# Patient Record
Sex: Female | Born: 1970 | Race: Black or African American | Hispanic: No | Smoking: Never smoker
Health system: Southern US, Community
[De-identification: ages and names within clinical notes are randomized; demographics above are authoritative.]

## PROBLEM LIST (undated history)

## (undated) DIAGNOSIS — J09X2 Influenza due to identified novel influenza A virus with other respiratory manifestations: Secondary | ICD-10-CM

## (undated) DIAGNOSIS — M940 Chondrocostal junction syndrome [Tietze]: Secondary | ICD-10-CM

## (undated) DIAGNOSIS — Z9889 Other specified postprocedural states: Secondary | ICD-10-CM

## (undated) DIAGNOSIS — D259 Leiomyoma of uterus, unspecified: Secondary | ICD-10-CM

## (undated) DIAGNOSIS — S83205S Other tear of unspecified meniscus, current injury, unspecified knee, sequela: Secondary | ICD-10-CM

## (undated) DIAGNOSIS — D649 Anemia, unspecified: Secondary | ICD-10-CM

## (undated) HISTORY — DX: Chondrocostal junction syndrome (tietze): M94.0

## (undated) HISTORY — PX: MYOMECTOMY: SHX85

## (undated) HISTORY — DX: Influenza due to identified novel influenza A virus with other respiratory manifestations: J09.X2

## (undated) HISTORY — PX: UTERINE ARTERY EMBOLIZATION: SHX2629

## (undated) HISTORY — DX: Other specified postprocedural states: Z98.890

## (undated) HISTORY — DX: Leiomyoma of uterus, unspecified: D25.9

## (undated) HISTORY — PX: CERVICAL BIOPSY: SHX590

---

## 1998-03-25 ENCOUNTER — Inpatient Hospital Stay (HOSPITAL_COMMUNITY): Admission: AD | Admit: 1998-03-25 | Discharge: 1998-03-25 | Payer: Self-pay | Admitting: Obstetrics & Gynecology

## 1998-08-16 ENCOUNTER — Other Ambulatory Visit: Admission: RE | Admit: 1998-08-16 | Discharge: 1998-08-16 | Payer: Self-pay | Admitting: Obstetrics and Gynecology

## 1999-01-09 ENCOUNTER — Ambulatory Visit: Admission: RE | Admit: 1999-01-09 | Discharge: 1999-01-09 | Payer: Self-pay | Admitting: Otolaryngology

## 2001-12-11 ENCOUNTER — Other Ambulatory Visit: Admission: RE | Admit: 2001-12-11 | Discharge: 2001-12-11 | Payer: Self-pay | Admitting: Obstetrics & Gynecology

## 2001-12-12 ENCOUNTER — Emergency Department (HOSPITAL_COMMUNITY): Admission: EM | Admit: 2001-12-12 | Discharge: 2001-12-12 | Payer: Self-pay | Admitting: Emergency Medicine

## 2001-12-13 ENCOUNTER — Encounter (HOSPITAL_COMMUNITY): Admission: RE | Admit: 2001-12-13 | Discharge: 2001-12-13 | Payer: Self-pay | Admitting: Oncology

## 2001-12-15 ENCOUNTER — Inpatient Hospital Stay (HOSPITAL_COMMUNITY): Admission: EM | Admit: 2001-12-15 | Discharge: 2001-12-18 | Payer: Self-pay | Admitting: Emergency Medicine

## 2001-12-15 ENCOUNTER — Encounter: Payer: Self-pay | Admitting: Emergency Medicine

## 2001-12-15 ENCOUNTER — Encounter: Payer: Self-pay | Admitting: Internal Medicine

## 2001-12-16 ENCOUNTER — Encounter (INDEPENDENT_AMBULATORY_CARE_PROVIDER_SITE_OTHER): Payer: Self-pay | Admitting: Cardiology

## 2001-12-17 ENCOUNTER — Encounter: Payer: Self-pay | Admitting: Internal Medicine

## 2002-01-08 ENCOUNTER — Ambulatory Visit (HOSPITAL_COMMUNITY): Admission: RE | Admit: 2002-01-08 | Discharge: 2002-01-08 | Payer: Self-pay | Admitting: Obstetrics & Gynecology

## 2002-01-08 ENCOUNTER — Encounter: Payer: Self-pay | Admitting: Obstetrics & Gynecology

## 2002-02-21 ENCOUNTER — Ambulatory Visit (HOSPITAL_COMMUNITY): Admission: RE | Admit: 2002-02-21 | Discharge: 2002-02-21 | Payer: Self-pay | Admitting: Obstetrics and Gynecology

## 2002-02-21 ENCOUNTER — Encounter: Payer: Self-pay | Admitting: Obstetrics and Gynecology

## 2002-03-03 ENCOUNTER — Observation Stay (HOSPITAL_COMMUNITY): Admission: RE | Admit: 2002-03-03 | Discharge: 2002-03-04 | Payer: Self-pay | Admitting: Interventional Radiology

## 2004-03-25 ENCOUNTER — Other Ambulatory Visit: Admission: RE | Admit: 2004-03-25 | Discharge: 2004-03-25 | Payer: Self-pay | Admitting: Obstetrics and Gynecology

## 2007-01-24 DIAGNOSIS — J09X2 Influenza due to identified novel influenza A virus with other respiratory manifestations: Secondary | ICD-10-CM

## 2007-01-24 HISTORY — DX: Influenza due to identified novel influenza A virus with other respiratory manifestations: J09.X2

## 2008-09-30 ENCOUNTER — Inpatient Hospital Stay (HOSPITAL_COMMUNITY): Admission: RE | Admit: 2008-09-30 | Discharge: 2008-10-03 | Payer: Self-pay | Admitting: Obstetrics and Gynecology

## 2008-09-30 ENCOUNTER — Encounter (INDEPENDENT_AMBULATORY_CARE_PROVIDER_SITE_OTHER): Payer: Self-pay | Admitting: Obstetrics and Gynecology

## 2008-09-30 DIAGNOSIS — Z9889 Other specified postprocedural states: Secondary | ICD-10-CM | POA: Insufficient documentation

## 2008-09-30 HISTORY — DX: Other specified postprocedural states: Z98.890

## 2008-11-15 ENCOUNTER — Emergency Department (HOSPITAL_COMMUNITY): Admission: EM | Admit: 2008-11-15 | Discharge: 2008-11-15 | Payer: Self-pay | Admitting: Emergency Medicine

## 2010-03-23 ENCOUNTER — Ambulatory Visit (INDEPENDENT_AMBULATORY_CARE_PROVIDER_SITE_OTHER): Payer: 59 | Admitting: Cardiology

## 2010-03-23 DIAGNOSIS — R079 Chest pain, unspecified: Secondary | ICD-10-CM

## 2010-03-23 DIAGNOSIS — M940 Chondrocostal junction syndrome [Tietze]: Secondary | ICD-10-CM

## 2010-04-29 LAB — CBC
HCT: 26 % — ABNORMAL LOW (ref 36.0–46.0)
HCT: 34 % — ABNORMAL LOW (ref 36.0–46.0)
Hemoglobin: 11.4 g/dL — ABNORMAL LOW (ref 12.0–15.0)
Hemoglobin: 8.9 g/dL — ABNORMAL LOW (ref 12.0–15.0)
MCHC: 33.6 g/dL (ref 30.0–36.0)
MCV: 88.9 fL (ref 78.0–100.0)
MCV: 89 fL (ref 78.0–100.0)
Platelets: 155 10*3/uL (ref 150–400)
Platelets: 219 10*3/uL (ref 150–400)
RBC: 3.82 MIL/uL — ABNORMAL LOW (ref 3.87–5.11)
RDW: 12.8 % (ref 11.5–15.5)
WBC: 12.4 10*3/uL — ABNORMAL HIGH (ref 4.0–10.5)
WBC: 6.9 10*3/uL (ref 4.0–10.5)

## 2010-04-29 LAB — BASIC METABOLIC PANEL
BUN: 9 mg/dL (ref 6–23)
CO2: 25 mEq/L (ref 19–32)
Calcium: 9.1 mg/dL (ref 8.4–10.5)
Chloride: 104 mEq/L (ref 96–112)
Creatinine, Ser: 0.8 mg/dL (ref 0.4–1.2)
GFR calc Af Amer: >60 mL/min (ref >60)
GFR calc non Af Amer: >60 mL/min (ref >60)
Glucose, Bld: 105 mg/dL — ABNORMAL HIGH (ref 70–99)
Potassium: 3.4 mEq/L — ABNORMAL LOW (ref 3.5–5.1)
Sodium: 136 mEq/L (ref 135–145)

## 2010-04-29 LAB — TYPE AND SCREEN
ABO/RH(D): O POS
Antibody Screen: NEGATIVE

## 2010-04-29 LAB — ABO/RH: ABO/RH(D): O POS

## 2010-06-07 NOTE — H&P (Signed)
[name], [name]                ACCOUNT NO.:  [id number]   MEDICAL RECORD NO.:  [id number]        PATIENT TYPE:  WAMB   LOCATION:                                FACILITY:  [facility]   PHYSICIAN:  Crist Fat. Rivard, M.D. DATE OF BIRTH:  01/30/1971   DATE OF ADMISSION:  09/30/2008  DATE OF DISCHARGE:                              HISTORY & PHYSICAL   HISTORY OF PRESENT ILLNESS:  [name] is a 40 year old married African  American female, para 0-0-1-0, presenting for abdominal myomectomy  because of enlarging uterine fibroids.  The patient has a long-standing  history of uterine fibroids for which she underwent uterine artery  embolization in 2004 due to 2 episodes of marked menorrhagia requiring  blood transfusion.  Since that time, the patient has continued to have a  menstrual flow which last approximately 7 days ago, during which time  she changes a pad every 2 hours and will subsequently experience clear  mucousy disorder for 5 days thereafter.  She denies any menstrual  cramping, dyspareunia, back pain, fever, or pelvic pressure, but she  does notice some increased urinary frequency.  A pelvic ultrasound in  May 2010 showed uterus measuring 15.4 cm x 12.8 cm x 13.2 cm with nine  measurable fibroids:  1. A posterior subserosal fibroid 6.3 x 5.57 x 5.13 cm.  2. A posterior subserosal 3.86 cm x 3.18 x 3.57 cm.  3. Left posterior subserosal 3.67 x 3.72 x 4.04 cm.  4. Left posterior subserosal 3.4 x 3.67 x 3.62 cm.  5. A left posterior subserosal measuring 2.48 x 2.55 x 1.7 cm.  6. A left lateral subserosal measuring 3.67 x 2.78 x 2.63 cm.  7. An anterior subserosal measuring 2.58 x 2.57 x 3.03 cm.  8. An anterior subserosal measuring 3.93 x 3.91 x 4.12 cm.  9. An anterior subserosal fibroid measuring 3.25 x 3.53 x 2.49 cm.   The patient's ovaries appeared within normal range on that study as did  her endometrial lining.  Since the patient has endured uterine fibroids  for many years,  she has become intimately acquainted with both medical  and surgical management options available to her for their management  and has decided to proceed with myomectomy.   PAST MEDICAL HISTORY:  OB history:  Gravida 1, para 0-0-1-0.  GYN  history:  Menarche is 40 years old, last menstrual period September 11, 2008.  She does not use any method of contraception.  Denies any history  of sexually transmitted diseases underwent colposcopy in 1997 for an  abnormal Pap smear, but Pap smear since that time has been normal with  the most recent being in June 2010.  Medical history is positive for  severe anemia secondary to menorrhagia.  The patient had blood  transfusion on two occasions in 2003 and 2004 (hemoglobin as low as 4).   SURGICAL HISTORY:  Uterine artery embolization in 2004.  The patient  denies any problems with anesthesia.   FAMILY HISTORY:  Diabetes mellitus, hypertension, and cardiovascular  disease.   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   HABITS:  She denies alcohol, tobacco, or illicit drug use.   CURRENT MEDICATIONS:  Aleve as needed.   She has no known drug allergies, also denies any sensitivities to latex  or shellfish.   REVIEW OF SYSTEMS:  The patient does wear glasses.  She denies any  headaches, blurred vision, dizziness, unilateral weakness, chest pain,  shortness of breath, nausea, vomiting, diarrhea, pelvic pain, myalgias,  arthralgias, and except as is mentioned in history present illness the  patient's review of systems is otherwise negative.   PHYSICAL EXAMINATION:  VITAL SIGNS:  Blood pressure 122/80, pulse is 76,  respiration 14, temperature 94.9 degrees Fahrenheit orally.  Weight 202  pounds, height 5 feet 4 inches tall.  Body mass index 34.7.  NECK:  Supple without masses.  There is no thyromegaly or cervical  adenopathy.  HEART:  Regular rate and rhythm.  LUNGS:  Clear.  BACK:  No CVA  tenderness.  ABDOMEN:  No tenderness or organomegaly; however, there is a firm mass  arising from the pelvis to approximately 4 fingerbreadths below the  umbilicus.  EXTREMITIES:  No clubbing, cyanosis, or edema.  PELVIC:  EG/BUS is within normal limits.  Vagina is rugous.  Cervix is  nontender without lesions.  Uterus appears 18-20 weeks' size without  tenderness.  Adnexa without tenderness or masses.   IMPRESSION:  1. Large uterine fibroids.  2. Status post uterine artery embolization.   DISPOSITION:  A discussion was held with the patient regarding the  indications for her procedure along with its risks which include but are  not limited to reaction to anesthesia, damage to adjacent organs,  infection, excessive bleeding, and the remote possibility that  hysterectomy may be required for any life-threatening hemorrhage.  The  patient verbalized understanding of these risks.  She has given 1 unit  of autologous blood to be used during her surgery.  She has consented to  proceed with an abdominal myomectomy at [facility] on  September 30, 2008, at 8:30 a.m.      [place] J. Adline Peals.      Crist Fat Rivard, M.D.  Electronically Signed    EJP/MEDQ  D:  09/23/2008  T:  09/24/2008  Job:  [id number]

## 2010-06-10 NOTE — Discharge Summary (Signed)
[name], [name] NO.:  [id number]   MEDICAL RECORD NO.:  [id number]                   PATIENT TYPE:  INP   LOCATION:  0352                                 FACILITY:  [facility]   PHYSICIAN:  Lazaro Arms, M.D.        DATE OF BIRTH:  May 04, 1970   DATE OF ADMISSION:  12/15/2001  DATE OF DISCHARGE:  12/18/2001                                 DISCHARGE SUMMARY   GYNECOLOGIST:  Dr. Cherly Hensen.   CONSULTATIONS:  Dr. Katrinka Blazing, from the cancer center.   DISCHARGE DIAGNOSIS:  Anemia, likely secondary to menorrhagia from fibroids.   HISTORY OF PRESENT ILLNESS:  The patient was brought to the emergency room  with weakness and some chest pain.  It started when she was found to have a  hemoglobin of 4 at her gynecologist's office, for which she was seeking some  medical advice regarding her menorrhagia.  She was sent over to the cancer  center, received IV iron therapy, and then two days later received two units  of packed red cells, and she received IV iron therapy.  However, after  having the IV iron therapy, she noticed some pleuritic chest pain and  shortness of breath and headache.  These symptoms have persisted, and so she  came to the emergency room for evaluation.  A chest x-ray showed some right  lower lobe atelectasis with a very small right-sided effusion and some  baseline cardiomegaly.  Her d-dimer was slightly elevated, and so she was  admitted, ruled out for myocardial infarction, and a V/Q scan was performed,  which ruled out for PE.   HOSPITAL COURSE:  Problem 1:  The patient was transfused a total of four  units to keep her hematocrit elevated, and she was discharged home with a  hematocrit of 29 on December 18, 2001.   Problem 2:  The pleuritic chest pain was still present; however, it receded,  and her only complaint was some mild reflux which responded well to Maalox.  It was thought that this was all due to side effects from the IV  iron.  Workup of this chest pain included a V/Q scan which was negative, an  echocardiogram which showed a completely normal ejection fraction, normal  valves, and no pericarditis.  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  This was  done uneventfully by Dr. Johna Sheriff.   DISPOSITION:  The patient was discharged home in stable condition.    FOLLOW-UP:  She is to follow up at the cancer center on Friday, December 20, 2001, and she is to call to make an appointment to see Dr. Cherly Hensen next  week.  Lazaro Arms, M.D.    AMC/MEDQ  D:  12/18/2001  T:  12/18/2001  Job:  [id number]   cc:   Maxie Better, M.D.  [address]  [address]  [place]  [place] [address]  Fax: [phone]   J. Aliene Altes, M.D.  [address] [place] [facility]  [place]  [place] [address]  Fax: [phone]

## 2011-02-03 ENCOUNTER — Ambulatory Visit (INDEPENDENT_AMBULATORY_CARE_PROVIDER_SITE_OTHER): Payer: 59

## 2011-02-03 DIAGNOSIS — R509 Fever, unspecified: Secondary | ICD-10-CM

## 2011-05-02 ENCOUNTER — Ambulatory Visit (INDEPENDENT_AMBULATORY_CARE_PROVIDER_SITE_OTHER): Payer: 59 | Admitting: Obstetrics and Gynecology

## 2011-05-02 ENCOUNTER — Encounter: Payer: Self-pay | Admitting: Obstetrics and Gynecology

## 2011-05-02 VITALS — BP 110/70 | HR 70 | Wt 218.0 lb

## 2011-05-02 DIAGNOSIS — B3731 Acute candidiasis of vulva and vagina: Secondary | ICD-10-CM

## 2011-05-02 DIAGNOSIS — N898 Other specified noninflammatory disorders of vagina: Secondary | ICD-10-CM

## 2011-05-02 DIAGNOSIS — B373 Candidiasis of vulva and vagina: Secondary | ICD-10-CM

## 2011-05-02 MED ORDER — FLUCONAZOLE 150 MG PO TABS: 150.0000 mg | ORAL_TABLET | Freq: Once | ORAL | Status: DC

## 2011-05-02 MED ORDER — TERCONAZOLE 0.4 % VA CREA: 1.0000 | TOPICAL_CREAM | Freq: Every day | VAGINAL | Status: DC

## 2011-05-02 NOTE — Progress Notes (Signed)
Patient with recent antibiotic therapy complaining of vaginal itching and discomfort.  Wet Prep: ph 4.5, negative whiff and + yeast  Assessment: Candida Vaginitis  Plan: Diflucan 150 mg 1 po stat with 1 refill          Terazol 7 Vaginal Cream 1 tube 1 applicatorful pv qhs           X 7 days no refill          RTO as scheduled

## 2011-05-02 NOTE — Patient Instructions (Signed)
Minimize sugar intake Don't wear underwear to bed except with menses Drink plenty of water Avoid douching

## 2011-05-02 NOTE — Progress Notes (Signed)
Addended by: Henreitta Leber on: 05/02/2011 10:43 AM   Modules accepted: Orders

## 2011-05-03 ENCOUNTER — Ambulatory Visit (INDEPENDENT_AMBULATORY_CARE_PROVIDER_SITE_OTHER): Payer: 59 | Admitting: Cardiology

## 2011-05-03 ENCOUNTER — Encounter: Payer: Self-pay | Admitting: Cardiology

## 2011-05-03 VITALS — BP 132/86 | HR 68 | Ht 63.0 in | Wt 218.7 lb

## 2011-05-03 DIAGNOSIS — M940 Chondrocostal junction syndrome [Tietze]: Secondary | ICD-10-CM | POA: Insufficient documentation

## 2011-05-03 NOTE — Assessment & Plan Note (Signed)
Once again I think that her chest pain is related to costochondritis. I recommended nonsteroidal anti-inflammatory medication and rest. She does not require any further cardiac evaluation since clearly her symptoms are noncardiac. I recommended that she followup with her primary care physician for further management.

## 2011-05-03 NOTE — Progress Notes (Signed)
   [name] Date of Birth: Jan 06, 1971 Medical Record #[id number]  History of Present Illness: [name] seen for evaluation of chest pain. She is a 41 year old black female well known to me. She reports that 2 weeks ago he began having chest pain again. She describes this as a constant/pressure sensation. It is a dull ache localized to the midsternal region and radiates under both breasts. She denies any relation to movement or deep breathing. She's had no belching or symptoms of acid reflux. She has taken ibuprofen twice a day with some mild relief. She has a known history of costochondritis. She has had extensive evaluation of her cardiac status in the past including echocardiograms, stress Cardiolite studies, CT of the chest and multiple ECGs. She states that her chest pain will go away for a period of time but has recurred about every 3 months.  Current Outpatient Prescriptions on File Prior to Visit  Medication Sig Dispense Refill  . ibuprofen (ADVIL,MOTRIN) 800 MG tablet Take 800 mg by mouth every 8 (eight) hours as needed.        Allergies  Allergen Reactions  . Pollen Extract Itching    Pollen make pt eyes itch.  lm    Past Medical History  Diagnosis Date  . Hx of myomectomy 09-30-2008    sr did the surgery  . Costochondritis     Acute  . Uterine fibroid   . Swine flu 2009  . Chest pain   . Costochondritis     Past Surgical History  Procedure Date  . Myomectomy     sr did surgery  . Uterine artery embolization     History  Smoking status  . Never Smoker   Smokeless tobacco  . Not on file    History  Alcohol Use No    Family History  Problem Relation Age of Onset  . Hypertension Mother   . Hypertension Brother     Review of Systems: The review of systems is positive for job-related stress.  All other systems were reviewed and are negative.  Physical Exam: BP 132/86  Pulse 68  Ht 5\' 3"  (1.6 m)  Wt 218 lb 11.2 oz (99.202 kg)  BMI 38.74 kg/m2  LMP  04/09/2011 He is a pleasant white female in no acute distress. Her HEENT exam is unremarkable. She has no JVD or bruits. Lungs are clear. Cardiac exam reveals a regular rate and rhythm without gallop, murmur, or click. She does have significant chest wall tenderness to palpation in the parasternal regions. This reproduces her pain. Abdomen is soft and nontender without masses or bruits. Bowel sounds are positive. She has no edema. Pedal pulses are good. LABORATORY DATA: ECG shows normal sinus rhythm with a normal ECG. Assessment / Plan:

## 2011-05-03 NOTE — Patient Instructions (Addendum)
Continue your anti-inflammatory medications ( Ibuprofen ) for pain  Costochondritis Costochondritis (Tietze syndrome), or costochondral separation, is a swelling and irritation (inflammation) of the tissue (cartilage) that connects your ribs with your breastbone (sternum). It may occur on its own (spontaneously), through damage caused by an accident (trauma), or simply from coughing or minor exercise. It may take up to 6 weeks to get better and longer if you are unable to be conservative in your activities. HOME CARE INSTRUCTIONS   Avoid exhausting physical activity. Try not to strain your ribs during normal activity. This would include any activities using chest, belly (abdominal), and side muscles, especially if heavy weights are used.   Use ice for 15 to 20 minutes per hour while awake for the first 2 days. Place the ice in a plastic bag, and place a towel between the bag of ice and your skin.   Only take over-the-counter or prescription medicines for pain, discomfort, or fever as directed by your caregiver.  SEEK IMMEDIATE MEDICAL CARE IF:   Your pain increases or you are very uncomfortable.   You have a fever.   You develop difficulty with your breathing.   You cough up blood.   You develop worse chest pains, shortness of breath, sweating, or vomiting.   You develop new, unexplained problems (symptoms).  MAKE SURE YOU:   Understand these instructions.   Will watch your condition.   Will get help right away if you are not doing well or get worse.  Document Released: 10/19/2004 Document Revised: 12/29/2010 Document Reviewed: 08/28/2007 [facility] Patient Information 2012 [place], [place].

## 2011-06-07 ENCOUNTER — Encounter: Payer: Self-pay | Admitting: Obstetrics and Gynecology

## 2011-06-07 ENCOUNTER — Ambulatory Visit (INDEPENDENT_AMBULATORY_CARE_PROVIDER_SITE_OTHER): Payer: 59 | Admitting: Obstetrics and Gynecology

## 2011-06-07 VITALS — BP 118/72 | HR 78 | Ht 63.0 in | Wt 217.0 lb

## 2011-06-07 DIAGNOSIS — N898 Other specified noninflammatory disorders of vagina: Secondary | ICD-10-CM

## 2011-06-07 DIAGNOSIS — D259 Leiomyoma of uterus, unspecified: Secondary | ICD-10-CM | POA: Insufficient documentation

## 2011-06-07 DIAGNOSIS — J101 Influenza due to other identified influenza virus with other respiratory manifestations: Secondary | ICD-10-CM

## 2011-06-07 DIAGNOSIS — Z9889 Other specified postprocedural states: Secondary | ICD-10-CM

## 2011-06-07 DIAGNOSIS — J09X2 Influenza due to identified novel influenza A virus with other respiratory manifestations: Secondary | ICD-10-CM | POA: Insufficient documentation

## 2011-06-07 NOTE — Progress Notes (Signed)
Color: CLEAR Odor: no Itching:no Thin:no Thick:yes Fever:no Dyspareunia:no Hx PID:no HX STD:no Pelvic Pain:no Desires Gc/CT:no Desires HIV,RPR,HbsAG:no

## 2011-06-07 NOTE — Progress Notes (Signed)
41 YO complains of vaginal discharge.  Recently treated for yeast vaginitis.    O: Pelvic: EGBUS-wnl, vagina-normal, cervix-no lesions uterus-normal size, adnexae-no masses  Wet Prep: pH 4.5, whiff-negative, no clue, trich or yeast  A: Vaginal Discharge   P:  Boric Acid Capsules 600 mg #30 1 pv twice weekly       for 4 weeks then prn 11 refills       Perineal hygiene

## 2011-06-07 NOTE — Patient Instructions (Signed)
Give note for work  Avoid: - excess soap on genital area (consider using plain oatmeal soap) - use of powder or sprays in genital area - douching - wearing underwear to bed (except with menses) - using more than is directed detergent when washing clothes - tight fitting garments around genital area - excess sugar intake   [employer] that carry Boric Acid Capsules/Suppositories:  [facility] [address] (only)  [phone]  [facility]  [address] [facility])  [phone]  [facility])  [address] [phone]  [facility]  [address] [phone]

## 2011-06-09 ENCOUNTER — Telehealth: Payer: Self-pay | Admitting: Obstetrics and Gynecology

## 2011-06-09 NOTE — Telephone Encounter (Signed)
Triage/epic 

## 2011-06-12 ENCOUNTER — Telehealth: Payer: Self-pay | Admitting: Obstetrics and Gynecology

## 2011-06-12 ENCOUNTER — Telehealth: Payer: Self-pay

## 2011-06-12 MED ORDER — TERCONAZOLE 0.8 % VA CREA: 1.0000 | TOPICAL_CREAM | Freq: Every day | VAGINAL | Status: AC

## 2011-06-12 MED ORDER — FLUCONAZOLE 150 MG PO TABS: 150.0000 mg | ORAL_TABLET | Freq: Once | ORAL | Status: AC

## 2011-06-12 NOTE — Telephone Encounter (Signed)
Return call to patient requesting "yeast" medicine though her recent visit did not show any.  To order Terazol 3 but patient advised that if symptoms persists she will need to schedule with a physician.  Patient verbalized understanding and was agreeable.

## 2011-06-12 NOTE — Telephone Encounter (Signed)
TC TO PT REGARDING MESSAGE. PT WANTS TO SPEAK WITH EP CONCERNING YEAST. PT STATES IT IS NOT CLEAR AND WANT ANOTHER RX FOR THE PROBLEM. INFORMED PT THAT I WILL GIVE MESSAGE TO EP AND GET BACK WITH PT. PT WANT TO HERE FROM EP.

## 2011-08-21 ENCOUNTER — Encounter: Payer: Self-pay | Admitting: Physical Medicine & Rehabilitation

## 2011-09-22 ENCOUNTER — Encounter: Payer: Self-pay | Admitting: Physical Medicine & Rehabilitation

## 2011-09-22 ENCOUNTER — Encounter: Payer: 59 | Attending: Physical Medicine & Rehabilitation

## 2011-09-22 ENCOUNTER — Ambulatory Visit (HOSPITAL_BASED_OUTPATIENT_CLINIC_OR_DEPARTMENT_OTHER): Payer: 59 | Admitting: Physical Medicine & Rehabilitation

## 2011-09-22 VITALS — BP 143/79 | HR 80 | Resp 16 | Ht 63.0 in | Wt 218.0 lb

## 2011-09-22 DIAGNOSIS — G5600 Carpal tunnel syndrome, unspecified upper limb: Secondary | ICD-10-CM

## 2011-09-22 DIAGNOSIS — G5610 Other lesions of median nerve, unspecified upper limb: Secondary | ICD-10-CM | POA: Insufficient documentation

## 2011-09-22 NOTE — Patient Instructions (Signed)
Return to Dr. Kellie Lowery for followup of your carpal tunnel syndrome

## 2011-09-22 NOTE — Progress Notes (Signed)
  Subjective:    Patient ID: [name], female    DOB: December 18, 1970, 41 y.o.   MRN: [id number]  HPI    Review of Systems     Objective:   Physical Exam        Assessment & Plan:  EMG performed 09/22/2011.  See EMG report under media tab.

## 2011-10-13 ENCOUNTER — Encounter: Payer: Self-pay | Admitting: Physical Medicine & Rehabilitation

## 2011-10-20 ENCOUNTER — Encounter: Payer: 59 | Admitting: Obstetrics and Gynecology

## 2011-10-25 ENCOUNTER — Encounter: Payer: 59 | Admitting: Obstetrics and Gynecology

## 2011-11-02 ENCOUNTER — Encounter: Payer: Self-pay | Admitting: Obstetrics and Gynecology

## 2011-11-02 ENCOUNTER — Ambulatory Visit (INDEPENDENT_AMBULATORY_CARE_PROVIDER_SITE_OTHER): Payer: 59 | Admitting: Obstetrics and Gynecology

## 2011-11-02 VITALS — BP 134/80 | Temp 98.3°F | Wt 216.0 lb

## 2011-11-02 DIAGNOSIS — D259 Leiomyoma of uterus, unspecified: Secondary | ICD-10-CM

## 2011-11-02 DIAGNOSIS — D219 Benign neoplasm of connective and other soft tissue, unspecified: Secondary | ICD-10-CM

## 2011-11-02 DIAGNOSIS — R350 Frequency of micturition: Secondary | ICD-10-CM

## 2011-11-02 LAB — POCT URINALYSIS DIPSTICK
Bilirubin, UA: NEGATIVE
Blood, UA: NEGATIVE
Glucose, UA: NEGATIVE
Ketones, UA: NEGATIVE
Spec Grav, UA: 1.02
pH, UA: 5

## 2011-11-02 MED ORDER — HYOSCYAMINE SULFATE 0.125 MG PO TABS: ORAL_TABLET | ORAL | Status: DC

## 2011-11-02 NOTE — Progress Notes (Signed)
Yes. Was a urine culture done?  AVS

## 2011-11-02 NOTE — Patient Instructions (Signed)
Take Aleve 2 tablets with food twice a day for 5 days

## 2011-11-02 NOTE — Progress Notes (Signed)
Vag. Discharge:no Odor:no Fever:no Irreg.Periods:no Dyspareunia:no Dysuria:no Frequency:yes Urgency:no Hematuria:no Kidney stones:no Constipation:no Diarrhea:no Rectal Bleeding: no Vomiting:no Nausea:no Pregnant:no Fibroids:yes H/O FIBROIDS HAD MYOMECTOMY Endometriosis:no Hx of Ovarian Cyst:no Hx IUD:no Hx STD-PID:no Appendectomy:no Gall Bladder Dz:no

## 2011-11-02 NOTE — Progress Notes (Signed)
41 YO complains of urinary frequency x 6 weeks. Has been to  a Urologist and had an ultrasound of her kidneys with normal results.  Patient is S/P [place] and myomectomy for fibroids.  Patient complains of nocturia (6 times a night) accompanied by dull lower back pain that gets better as she moves around. upon awakening.  During the day patient will void every 3 hours.  Menses flow 3-6 days with pad change 3-6 times a day with no cramps.  Denies leaking of urine, urgency or blood in urine.  Has no change in bowel movements or dyspareunia.  Lastly patient reports that she doesn't feel like she completely empties her bladder.   O: U/A-normal  Back: no CVA tenderness, tenderness over SI joints bilaterally Abdomen: soft, diffusely tender without guarding or rebound Pelvic: EGBUS-wnl, vagina-normal, cervix- no lesions, uterus-ULNS, difficult to assess due to body habitus, mildly tender; adnexae-no tenderness or masses  A: Nocturia     SI Joint Inflammation     Fibroids  P:  Pelvic U/S to assess fibroids      As M.D. about suitability of Lumax Urodynamics for these symptoms      Hyoscyamine 0.125 # 7 1 po pc in evening     Aleve 2 po pc bid x 5 days  Tanica Gaige, PA-C

## 2011-11-03 NOTE — Progress Notes (Signed)
Per Dr. Stefano Gaul, patient may benefit from a LUMAX evaluation.  Request sent to appointments for scheduling. Otila Starn, PA-C

## 2011-11-07 ENCOUNTER — Encounter: Payer: Self-pay | Admitting: Obstetrics and Gynecology

## 2011-11-07 ENCOUNTER — Ambulatory Visit (INDEPENDENT_AMBULATORY_CARE_PROVIDER_SITE_OTHER): Payer: 59 | Admitting: Obstetrics and Gynecology

## 2011-11-07 ENCOUNTER — Ambulatory Visit (INDEPENDENT_AMBULATORY_CARE_PROVIDER_SITE_OTHER): Payer: 59

## 2011-11-07 VITALS — BP 114/80 | HR 78 | Wt 215.0 lb

## 2011-11-07 DIAGNOSIS — D259 Leiomyoma of uterus, unspecified: Secondary | ICD-10-CM

## 2011-11-07 DIAGNOSIS — R351 Nocturia: Secondary | ICD-10-CM

## 2011-11-07 DIAGNOSIS — M533 Sacrococcygeal disorders, not elsewhere classified: Secondary | ICD-10-CM

## 2011-11-07 DIAGNOSIS — D219 Benign neoplasm of connective and other soft tissue, unspecified: Secondary | ICD-10-CM

## 2011-11-07 DIAGNOSIS — R350 Frequency of micturition: Secondary | ICD-10-CM

## 2011-11-07 MED ORDER — HYOSCYAMINE SULFATE 0.375 MG PO CP12: 0.3750 mg | ORAL_CAPSULE | Freq: Every day | ORAL | Status: DC

## 2011-11-07 NOTE — Progress Notes (Signed)
41 YO S/P [place] & myomectomy for fibroids returns for ultrasound follow up.  Seen last week with 6 month history of nocturia (6 times a night) and lower back pain (SI joint pain) with a negative Urology work up.  Was placed on a trial of hyosycamine for nocturia and Aleve for SI joint pain. Patient reports nocturia x 4 instead of  6 with the hyoscyamine 0.125 and minimal back pain relief with Aleve 2 po pc bid x 3 days.  Patient goes on to say that she notices that her back really hurts when she awakens on her stomach. (typicallly a side sleeper)  O: U/S: uterus-10.2 x 6.35 x 5.79 cm, endometrium-0.878 cm;  heterogeneous echo pattern to myometrium with muliple hyperechoic "specs" scattered around uterine fundal borders, several myometrial fibroids noted measuring 1-1.5 cm;  ovaries appear normal  A:  Nocturia (mild improvement with hyoscyamine)      Lower Back (SI joint inflammation)-no improvement with Aleve  P:  Schedule Lumax and follow up with Dr. Katy Fitch Dr. Stefano Gaul)       Hyoscyamine 0.375  #30 1 po qhs no refills       RTO-as scheduled or prn  Amber An, PA-C

## 2011-11-07 NOTE — Patient Instructions (Signed)
Lumax bladder testing Urodynamics

## 2011-11-17 ENCOUNTER — Telehealth: Payer: Self-pay | Admitting: Obstetrics and Gynecology

## 2011-11-17 NOTE — Telephone Encounter (Signed)
Please see pt msg, Thanks

## 2011-11-20 ENCOUNTER — Telehealth: Payer: Self-pay | Admitting: Obstetrics and Gynecology

## 2011-12-01 ENCOUNTER — Encounter: Payer: Self-pay | Admitting: Obstetrics and Gynecology

## 2011-12-01 ENCOUNTER — Ambulatory Visit (INDEPENDENT_AMBULATORY_CARE_PROVIDER_SITE_OTHER): Payer: 59 | Admitting: Obstetrics and Gynecology

## 2011-12-01 VITALS — BP 100/70 | Ht 63.0 in | Wt 217.0 lb

## 2011-12-01 DIAGNOSIS — R351 Nocturia: Secondary | ICD-10-CM | POA: Insufficient documentation

## 2011-12-01 MED ORDER — SOLIFENACIN SUCCINATE 5 MG PO TABS: 5.0000 mg | ORAL_TABLET | Freq: Every day | ORAL | Status: DC

## 2011-12-01 NOTE — Addendum Note (Signed)
Addended by: Osborn Coho on: 12/01/2011 11:09 AM   Modules accepted: Orders

## 2011-12-01 NOTE — Progress Notes (Signed)
Here to f/u lumax testing.  C/o no leaking but reports frequency at night.    Filed Vitals:   12/01/11 1013  BP: 100/70   A/P D/c hyoscyamine - it helps a little but she still goes 4x/night Pt is also s/p urology w/u according to EPs last note Trial of vesicare (pt is hesitant to take bc she doesn't like taking meds but feels like she needs to try something) Avoid caffeine in afternoon and evening before bed RTO 6-8wks to f/u vesicare Ask pt about h/o myomectomy and when the last time she had u/s to check fibroids bc that can sometimes cause frequency depending on where they are located Had a nl fasting glucose recently per pt

## 2012-01-29 ENCOUNTER — Encounter: Payer: 59 | Admitting: Obstetrics and Gynecology

## 2012-01-31 ENCOUNTER — Other Ambulatory Visit (INDEPENDENT_AMBULATORY_CARE_PROVIDER_SITE_OTHER): Payer: Self-pay | Admitting: Otolaryngology

## 2012-01-31 DIAGNOSIS — J310 Chronic rhinitis: Secondary | ICD-10-CM

## 2012-01-31 DIAGNOSIS — J329 Chronic sinusitis, unspecified: Secondary | ICD-10-CM

## 2012-02-12 ENCOUNTER — Ambulatory Visit
Admission: RE | Admit: 2012-02-12 | Discharge: 2012-02-12 | Disposition: A | Discharge status: Home / Self Care | Payer: 59 | Source: Ambulatory Visit | Attending: Otolaryngology | Admitting: Otolaryngology

## 2012-02-12 DIAGNOSIS — J329 Chronic sinusitis, unspecified: Secondary | ICD-10-CM

## 2012-02-12 DIAGNOSIS — J310 Chronic rhinitis: Secondary | ICD-10-CM

## 2012-02-14 ENCOUNTER — Ambulatory Visit: Payer: 59 | Admitting: Obstetrics and Gynecology

## 2012-02-14 ENCOUNTER — Encounter: Payer: Self-pay | Admitting: Obstetrics and Gynecology

## 2012-02-14 VITALS — BP 116/80 | Ht 63.0 in | Wt 218.0 lb

## 2012-02-14 DIAGNOSIS — Z124 Encounter for screening for malignant neoplasm of cervix: Secondary | ICD-10-CM

## 2012-02-14 DIAGNOSIS — N898 Other specified noninflammatory disorders of vagina: Secondary | ICD-10-CM

## 2012-02-14 DIAGNOSIS — D259 Leiomyoma of uterus, unspecified: Secondary | ICD-10-CM

## 2012-02-14 DIAGNOSIS — Z9889 Other specified postprocedural states: Secondary | ICD-10-CM

## 2012-02-14 DIAGNOSIS — Z01419 Encounter for gynecological examination (general) (routine) without abnormal findings: Secondary | ICD-10-CM

## 2012-02-14 MED ORDER — FLUCONAZOLE 150 MG PO TABS: ORAL_TABLET | ORAL | Status: DC

## 2012-02-14 MED ORDER — TERCONAZOLE 80 MG VA SUPP: VAGINAL | Status: DC

## 2012-02-14 NOTE — Progress Notes (Signed)
The patient reports:Pt states may have ?yeast inf from taking antibiotics for sinus infection  Contraception:None   Last mammogram: none Last pap: 02/09/2011 Normal  GC/Chlamydia cultures offered: declined HIV/RPR/HbsAg offered:  declined HSV 1 and 2 glycoprotein offered: declined  Menstrual cycle regular and monthly: Yes every 26-28 days Menstrual flow normal: Yes lasts 3-5 days   Urinary symptoms: none Normal bowel movements: Yes Reports abuse at home: No:   Subjective:    [name] is a 42 y.o. female, G1P0010, who presents for an annual exam.     History   Social History  . Marital Status: Single    Spouse Name: N/A    Number of Children: 0  . Years of Education: N/A   Occupational History  . [employer]   Social History Main Topics  . Smoking status: Never Smoker   . Smokeless tobacco: Never Used  . Alcohol Use: No  . Drug Use: No  . Sexually Active: Yes -- Female partner(s)    Birth Control/ Protection: None   Other Topics Concern  . None   Social History Narrative  . None    Menstrual cycle:   LMP: Patient's last menstrual period was 01/28/2012.           Cycle: normal  The following portions of the patient's history were reviewed and updated as appropriate: allergies, current medications, past family history, past medical history, past social history, past surgical history and problem list.  Review of Systems Pertinent items are noted in HPI. Breast:Negative for breast lump,nipple discharge or nipple retraction Gastrointestinal: Negative for abdominal pain, change in bowel habits or rectal bleeding Urinary:negative   Objective:    LMP 01/28/2012    Weight:  Wt Readings from Last 1 Encounters:  12/01/11 217 lb (98.431 kg)          BMI: There is no height or weight on file to calculate BMI.  General Appearance: Alert, appropriate appearance for age. No acute distress HEENT: Grossly normal Neck / Thyroid:  Supple, no masses, nodes or enlargement Lungs: clear to auscultation bilaterally Back: No CVA tenderness Breast Exam: No masses or nodes.No dimpling, nipple retraction or discharge. Cardiovascular: Regular rate and rhythm. S1, S2, no murmur Gastrointestinal: Soft, non-tender, no masses or organomegaly Pelvic Exam: Vulva and vagina appear normal. Bimanual exam reveals normal uterus and adnexa. Uterus bulky  Approx. 8 weeks  NSC Rectovaginal: normal rectal, no masses Lymphatic Exam: Non-palpable nodes in neck, clavicular, axillary, or inguinal regions Skin: no rash or abnormalities Neurologic: Normal gait and speech, no tremor  Psychiatric: Alert and oriented, appropriate affect.     Assessment:    Normal gyn exam    Plan:    mammogram pap smear return annually or prn STD screening: declined Contraception:no method MMG with BC  Daily Probiotics reccommended Diflucan 150 mg one PO when finished with abx   Silverio Lay MD

## 2012-02-16 LAB — PAP IG W/ RFLX HPV ASCU

## 2012-03-01 ENCOUNTER — Ambulatory Visit (HOSPITAL_BASED_OUTPATIENT_CLINIC_OR_DEPARTMENT_OTHER): Payer: 59

## 2012-04-05 ENCOUNTER — Ambulatory Visit (HOSPITAL_BASED_OUTPATIENT_CLINIC_OR_DEPARTMENT_OTHER): Payer: 59

## 2012-06-10 DIAGNOSIS — J302 Other seasonal allergic rhinitis: Secondary | ICD-10-CM | POA: Insufficient documentation

## 2013-03-19 ENCOUNTER — Other Ambulatory Visit: Payer: Self-pay | Admitting: Obstetrics and Gynecology

## 2013-03-19 DIAGNOSIS — Z1231 Encounter for screening mammogram for malignant neoplasm of breast: Secondary | ICD-10-CM

## 2013-04-07 ENCOUNTER — Ambulatory Visit
Admission: RE | Admit: 2013-04-07 | Discharge: 2013-04-07 | Disposition: A | Discharge status: Home / Self Care | Payer: 59 | Source: Ambulatory Visit | Attending: Obstetrics and Gynecology | Admitting: Obstetrics and Gynecology

## 2013-04-07 DIAGNOSIS — Z1231 Encounter for screening mammogram for malignant neoplasm of breast: Secondary | ICD-10-CM

## 2013-04-08 ENCOUNTER — Other Ambulatory Visit: Payer: Self-pay | Admitting: Obstetrics and Gynecology

## 2013-04-08 DIAGNOSIS — R928 Other abnormal and inconclusive findings on diagnostic imaging of breast: Secondary | ICD-10-CM

## 2013-04-18 ENCOUNTER — Ambulatory Visit
Admission: RE | Admit: 2013-04-18 | Discharge: 2013-04-18 | Disposition: A | Discharge status: Home / Self Care | Payer: 59 | Source: Ambulatory Visit | Attending: Obstetrics and Gynecology | Admitting: Obstetrics and Gynecology

## 2013-04-18 DIAGNOSIS — R928 Other abnormal and inconclusive findings on diagnostic imaging of breast: Secondary | ICD-10-CM

## 2013-04-21 ENCOUNTER — Ambulatory Visit (INDEPENDENT_AMBULATORY_CARE_PROVIDER_SITE_OTHER): Payer: 59 | Admitting: Family Medicine

## 2013-04-21 ENCOUNTER — Ambulatory Visit: Payer: 59

## 2013-04-21 VITALS — BP 112/78 | HR 78 | Temp 98.3°F | Resp 18 | Ht 65.0 in | Wt 220.0 lb

## 2013-04-21 DIAGNOSIS — S20229A Contusion of unspecified back wall of thorax, initial encounter: Secondary | ICD-10-CM

## 2013-04-21 DIAGNOSIS — M25529 Pain in unspecified elbow: Secondary | ICD-10-CM

## 2013-04-21 DIAGNOSIS — M25521 Pain in right elbow: Secondary | ICD-10-CM

## 2013-04-21 DIAGNOSIS — S5000XA Contusion of unspecified elbow, initial encounter: Secondary | ICD-10-CM

## 2013-04-21 DIAGNOSIS — S300XXA Contusion of lower back and pelvis, initial encounter: Secondary | ICD-10-CM

## 2013-04-21 DIAGNOSIS — M545 Low back pain, unspecified: Secondary | ICD-10-CM

## 2013-04-21 DIAGNOSIS — S5001XA Contusion of right elbow, initial encounter: Secondary | ICD-10-CM

## 2013-04-21 MED ORDER — HYDROCODONE-ACETAMINOPHEN 5-325 MG PO TABS: ORAL_TABLET | ORAL | Status: DC

## 2013-04-21 NOTE — Progress Notes (Signed)
Subjective: 43 year old lady who fell on the staircase at home this morning. She grabbed a handrail which came loose and she probably fell down as well as hurting herself with the handrail. She has been injury duty all day sitting on a hard bench. She is hurting in the right elbow as well as across the very low back area. She's gotten stiff.  Objective: Gets up and down with some difficulty. He is tender in the back across just below the SI joint region and the pelvic brim region all across the top of her buttocks and very low back area. Flexion and extension are fair but at about 60 of flexion she gets a good deal of pain. Tilt is satisfactory. Normal sensation in the legs.  The right elbow is very tender at the olecranon and just lateral to it and up into the very low part of her upper arm. The pain is enough that she has some diminished grip due to the pain. Sensory grossly intact. Pulses adequate.  Assessment: Trauma and pain right elbow and low back  Plan: X-ray right elbow. I do not believe that the back will require any x-rays.  [facility] reading (PRIMARY) by  Dr. Linna Darner Normal elbow  Contusions and pain. Will treat symptomatically .

## 2013-04-21 NOTE — Patient Instructions (Addendum)
Apply ice to elbow. On low back consider alternating ice and heat.  Take ibuprofen maximum of 800 mg 3 times daily or Aleve foreign 40 mg twice daily  Use the hydrocodone pain pills for severe pain only

## 2013-04-29 ENCOUNTER — Other Ambulatory Visit: Payer: Self-pay | Admitting: Obstetrics and Gynecology

## 2013-04-29 DIAGNOSIS — N6489 Other specified disorders of breast: Secondary | ICD-10-CM

## 2013-04-29 DIAGNOSIS — N644 Mastodynia: Secondary | ICD-10-CM

## 2013-05-06 ENCOUNTER — Other Ambulatory Visit: Payer: Self-pay | Admitting: Obstetrics and Gynecology

## 2013-05-06 ENCOUNTER — Ambulatory Visit
Admission: RE | Admit: 2013-05-06 | Discharge: 2013-05-06 | Disposition: A | Discharge status: Home / Self Care | Payer: 59 | Source: Ambulatory Visit | Attending: Obstetrics and Gynecology | Admitting: Obstetrics and Gynecology

## 2013-05-06 DIAGNOSIS — N6489 Other specified disorders of breast: Secondary | ICD-10-CM

## 2013-05-06 DIAGNOSIS — N644 Mastodynia: Secondary | ICD-10-CM

## 2013-05-27 ENCOUNTER — Ambulatory Visit (INDEPENDENT_AMBULATORY_CARE_PROVIDER_SITE_OTHER): Payer: 59 | Admitting: Family Medicine

## 2013-05-27 VITALS — BP 120/76 | HR 77 | Temp 98.3°F | Resp 16 | Ht 63.0 in | Wt 220.4 lb

## 2013-05-27 DIAGNOSIS — M7711 Lateral epicondylitis, right elbow: Secondary | ICD-10-CM

## 2013-05-27 DIAGNOSIS — M7710 Lateral epicondylitis, unspecified elbow: Secondary | ICD-10-CM

## 2013-05-27 MED ORDER — DICLOFENAC SODIUM 75 MG PO TBEC: 75.0000 mg | DELAYED_RELEASE_TABLET | Freq: Two times a day (BID) | ORAL | Status: DC

## 2013-05-27 MED ORDER — CYCLOBENZAPRINE HCL 5 MG PO TABS: 5.0000 mg | ORAL_TABLET | Freq: Every day | ORAL | Status: DC

## 2013-05-27 NOTE — Patient Instructions (Signed)
Lateral Epicondylitis (Tennis Elbow) with Rehab Lateral epicondylitis involves inflammation and pain around the outer portion of the elbow. The pain is caused by inflammation of the tendons in the forearm that bring back (extend) the wrist. Lateral epicondylittis is also called tennis elbow, because it is very common in tennis players. However, it may occur in any individual who extends the wrist repetitively. If lateral epicondylitis is left untreated, it may become a chronic problem. SYMPTOMS   Pain, tenderness, and inflammation on the outer (lateral) side of the elbow.  Pain or weakness with gripping activities.  Pain that increases with wrist twisting motions (playing tennis, using a screwdriver, opening a door or a jar).  Pain with lifting objects, including a coffee cup. CAUSES  Lateral epicondylitis is caused by inflammation of the tendons that extend the wrist. Causes of injury may include:  Repetitive stress and strain on the muscles and tendons that extend the wrist.  Sudden change in activity level or intensity.  Incorrect grip in racquet sports.  Incorrect grip size of racquet (often too large).  Incorrect hitting position or technique (usually backhand, leading with the elbow).  Using a racket that is too heavy. RISK INCREASES WITH:  Sports or occupations that require repetitive and/or strenuous forearm and wrist movements (tennis, squash, racquetball, carpentry).  Poor wrist and forearm strength and flexibility.  Failure to warm up properly before activity.  Resuming activity before healing, rehabilitation, and conditioning are complete. PREVENTION   Warm up and stretch properly before activity.  Maintain physical fitness:  Strength, flexibility, and endurance.  Cardiovascular fitness.  Wear and use properly fitted equipment.  Learn and use proper technique and have a coach correct improper technique.  Wear a tennis elbow (counterforce) brace. PROGNOSIS   The course of this condition depends on the degree of the injury. If treated properly, acute cases (symptoms lasting less than 4 weeks) are often resolved in 2 to 6 weeks. Chronic (longer lasting cases) often resolve in 3 to 6 months, but may require physical therapy. RELATED COMPLICATIONS   Frequently recurring symptoms, resulting in a chronic problem. Properly treating the problem the first time decreases frequency of recurrence.  Chronic inflammation, scarring tendon degeneration, and partial tendon tear, requiring surgery.  Delayed healing or resolution of symptoms. TREATMENT  Treatment first involves the use of ice and medicine, to reduce pain and inflammation. Strengthening and stretching exercises may help reduce discomfort, if performed regularly. These exercises may be performed at home, if the condition is an acute injury. Chronic cases may require a referral to a physical therapist for evaluation and treatment. Your caregiver may advise a corticosteroid injection, to help reduce inflammation. Rarely, surgery is needed. MEDICATION  If pain medicine is needed, nonsteroidal anti-inflammatory medicines (aspirin and ibuprofen), or other minor pain relievers (acetaminophen), are often advised.  Do not take pain medicine for 7 days before surgery.  Prescription pain relievers may be given, if your caregiver thinks they are needed. Use only as directed and only as much as you need.  Corticosteroid injections may be recommended. These injections should be reserved only for the most severe cases, because they can only be given a certain number of times. HEAT AND COLD  Cold treatment (icing) should be applied for 10 to 15 minutes every 2 to 3 hours for inflammation and pain, and immediately after activity that aggravates your symptoms. Use ice packs or an ice massage.  Heat treatment may be used before performing stretching and strengthening activities prescribed by your  caregiver, physical  therapist, or athletic trainer. Use a heat pack or a warm water soak. SEEK MEDICAL CARE IF: Symptoms get worse or do not improve in 2 weeks, despite treatment. EXERCISES  RANGE OF MOTION (ROM) AND STRETCHING EXERCISES - Epicondylitis, Lateral (Tennis Elbow) These exercises may help you when beginning to rehabilitate your injury. Your symptoms may go away with or without further involvement from your physician, physical therapist or athletic trainer. While completing these exercises, remember:   Restoring tissue flexibility helps normal motion to return to the joints. This allows healthier, less painful movement and activity.  An effective stretch should be held for at least 30 seconds.  A stretch should never be painful. You should only feel a gentle lengthening or release in the stretched tissue. RANGE OF MOTION  Wrist Flexion, Active-Assisted  Extend your right / left elbow with your fingers pointing down.*  Gently pull the back of your hand towards you, until you feel a gentle stretch on the top of your forearm.  Hold this position for __________ seconds. Repeat __________ times. Complete this exercise __________ times per day.  *If directed by your physician, physical therapist or athletic trainer, complete this stretch with your elbow bent, rather than extended. RANGE OF MOTION  Wrist Extension, Active-Assisted  Extend your right / left elbow and turn your palm upwards.*  Gently pull your palm and fingertips back, so your wrist extends and your fingers point more toward the ground.  You should feel a gentle stretch on the inside of your forearm.  Hold this position for __________ seconds. Repeat __________ times. Complete this exercise __________ times per day. *If directed by your physician, physical therapist or athletic trainer, complete this stretch with your elbow bent, rather than extended. STRETCH - Wrist Flexion  Place the back of your right / left hand on a tabletop,  leaving your elbow slightly bent. Your fingers should point away from your body.  Gently press the back of your hand down onto the table by straightening your elbow. You should feel a stretch on the top of your forearm.  Hold this position for __________ seconds. Repeat __________ times. Complete this stretch __________ times per day.  STRETCH  Wrist Extension   Place your right / left fingertips on a tabletop, leaving your elbow slightly bent. Your fingers should point backwards.  Gently press your fingers and palm down onto the table by straightening your elbow. You should feel a stretch on the inside of your forearm.  Hold this position for __________ seconds. Repeat __________ times. Complete this stretch __________ times per day.  STRENGTHENING EXERCISES - Epicondylitis, Lateral (Tennis Elbow) These exercises may help you when beginning to rehabilitate your injury. They may resolve your symptoms with or without further involvement from your physician, physical therapist or athletic trainer. While completing these exercises, remember:   Muscles can gain both the endurance and the strength needed for everyday activities through controlled exercises.  Complete these exercises as instructed by your physician, physical therapist or athletic trainer. Increase the resistance and repetitions only as guided.  You may experience muscle soreness or fatigue, but the pain or discomfort you are trying to eliminate should never worsen during these exercises. If this pain does get worse, stop and make sure you are following the directions exactly. If the pain is still present after adjustments, discontinue the exercise until you can discuss the trouble with your caregiver. STRENGTH Wrist Flexors  Sit with your right / left forearm palm-up and  fully supported on a table or countertop. Your elbow should be resting below the height of your shoulder. Allow your wrist to extend over the edge of the  surface.  Loosely holding a __________ weight, or a piece of rubber exercise band or tubing, slowly curl your hand up toward your forearm.  Hold this position for __________ seconds. Slowly lower the wrist back to the starting position in a controlled manner. Repeat __________ times. Complete this exercise __________ times per day.  STRENGTH  Wrist Extensors  Sit with your right / left forearm palm-down and fully supported on a table or countertop. Your elbow should be resting below the height of your shoulder. Allow your wrist to extend over the edge of the surface.  Loosely holding a __________ weight, or a piece of rubber exercise band or tubing, slowly curl your hand up toward your forearm.  Hold this position for __________ seconds. Slowly lower the wrist back to the starting position in a controlled manner. Repeat __________ times. Complete this exercise __________ times per day.  STRENGTH - Ulnar Deviators  Stand with a ____________________ weight in your right / left hand, or sit while holding a rubber exercise band or tubing, with your healthy arm supported on a table or countertop.  Move your wrist, so that your pinkie travels toward your forearm and your thumb moves away from your forearm.  Hold this position for __________ seconds and then slowly lower the wrist back to the starting position. Repeat __________ times. Complete this exercise __________ times per day STRENGTH - Radial Deviators  Stand with a ____________________ weight in your right / left hand, or sit while holding a rubber exercise band or tubing, with your injured arm supported on a table or countertop.  Raise your hand upward in front of you or pull up on the rubber tubing.  Hold this position for __________ seconds and then slowly lower the wrist back to the starting position. Repeat __________ times. Complete this exercise __________ times per day. STRENGTH  Forearm Supinators   Sit with your right /  left forearm supported on a table, keeping your elbow below shoulder height. Rest your hand over the edge, palm down.  Gently grip a hammer or a soup ladle.  Without moving your elbow, slowly turn your palm and hand upward to a "thumbs-up" position.  Hold this position for __________ seconds. Slowly return to the starting position. Repeat __________ times. Complete this exercise __________ times per day.  STRENGTH  Forearm Pronators   Sit with your right / left forearm supported on a table, keeping your elbow below shoulder height. Rest your hand over the edge, palm up.  Gently grip a hammer or a soup ladle.  Without moving your elbow, slowly turn your palm and hand upward to a "thumbs-up" position.  Hold this position for __________ seconds. Slowly return to the starting position. Repeat __________ times. Complete this exercise __________ times per day.  STRENGTH - Grip  Grasp a tennis ball, a dense sponge, or a large, rolled sock in your hand.  Squeeze as hard as you can, without increasing any pain.  Hold this position for __________ seconds. Release your grip slowly. Repeat __________ times. Complete this exercise __________ times per day.  STRENGTH - Elbow Extensors, Isometric  Stand or sit upright, on a firm surface. Place your right / left arm so that your palm faces your stomach, and it is at the height of your waist.  Place your opposite hand on  the underside of your forearm. Gently push up as your right / left arm resists. Push as hard as you can with both arms, without causing any pain or movement at your right / left elbow. Hold this stationary position for __________ seconds. Gradually release the tension in both arms. Allow your muscles to relax completely before repeating. Document Released: 01/09/2005 Document Revised: 04/03/2011 Document Reviewed: 04/23/2008 [facility] Patient Information 2014 [place], [place].

## 2013-05-27 NOTE — Progress Notes (Signed)
° °  Subjective:    Patient ID: [name], female    DOB: 1970-02-12, 43 y.o.   MRN: [id number]  Arm Pain   This chart was scribed for Robyn Haber, MD by Erling Conte, Scribe. This patient was seen in room Room 10 and the patient's care was started at 8:57 AM.   HPI Comments: [name] is a 43 y.o. female who presents to the [facility] complaining of right elbow pain onset 3 weeks ago. Patient states that the pain is exacerbated by movement and lifting. Patient states that she fell on the right elbow about 3 weeks ago.  Tender on radius and lateral  Patient works in social work at [employer]     Objective:   Physical Exam  Patient has full range of motion of her right elbow although she does have pain when it is fully extended. She has tenderness over the lateral epicondyle extending along the greater radialis muscle.  There is no overlying ecchymosis and there is no bony abnormality      Assessment & Plan:  Lateral epicondylitis of right elbow - Plan: diclofenac (VOLTAREN) 75 MG EC tablet, cyclobenzaprine (FLEXERIL) 5 MG tablet Recheck 7-10 days if pain continues Signed, Robyn Haber, MD

## 2013-06-13 ENCOUNTER — Telehealth: Payer: Self-pay

## 2013-06-13 NOTE — Telephone Encounter (Signed)
Patient left voicemail yesterday (06/12/13) requesting xray disc related to elbow injury visit for an upcoming ortho appt. X-ray report printed. Will forward request to xray to make disc. Needs today.

## 2014-07-03 ENCOUNTER — Encounter (HOSPITAL_BASED_OUTPATIENT_CLINIC_OR_DEPARTMENT_OTHER): Payer: Self-pay

## 2014-07-03 ENCOUNTER — Emergency Department (HOSPITAL_BASED_OUTPATIENT_CLINIC_OR_DEPARTMENT_OTHER)
Admission: EM | Admit: 2014-07-03 | Discharge: 2014-07-03 | Disposition: A | Discharge status: Home / Self Care | Payer: 59 | Attending: Emergency Medicine | Admitting: Emergency Medicine

## 2014-07-03 DIAGNOSIS — Z8709 Personal history of other diseases of the respiratory system: Secondary | ICD-10-CM | POA: Diagnosis not present

## 2014-07-03 DIAGNOSIS — S70361A Insect bite (nonvenomous), right thigh, initial encounter: Secondary | ICD-10-CM | POA: Diagnosis present

## 2014-07-03 DIAGNOSIS — Z79899 Other long term (current) drug therapy: Secondary | ICD-10-CM | POA: Insufficient documentation

## 2014-07-03 DIAGNOSIS — Y998 Other external cause status: Secondary | ICD-10-CM | POA: Diagnosis not present

## 2014-07-03 DIAGNOSIS — Y9289 Other specified places as the place of occurrence of the external cause: Secondary | ICD-10-CM | POA: Insufficient documentation

## 2014-07-03 DIAGNOSIS — S71151A Open bite, right thigh, initial encounter: Secondary | ICD-10-CM

## 2014-07-03 DIAGNOSIS — W57XXXA Bitten or stung by nonvenomous insect and other nonvenomous arthropods, initial encounter: Secondary | ICD-10-CM | POA: Insufficient documentation

## 2014-07-03 DIAGNOSIS — Z791 Long term (current) use of non-steroidal anti-inflammatories (NSAID): Secondary | ICD-10-CM | POA: Diagnosis not present

## 2014-07-03 DIAGNOSIS — Y9389 Activity, other specified: Secondary | ICD-10-CM | POA: Insufficient documentation

## 2014-07-03 DIAGNOSIS — Z86018 Personal history of other benign neoplasm: Secondary | ICD-10-CM | POA: Insufficient documentation

## 2014-07-03 MED ORDER — CEPHALEXIN 500 MG PO CAPS: 500.0000 mg | ORAL_CAPSULE | Freq: Four times a day (QID) | ORAL | Status: DC

## 2014-07-03 MED ORDER — SULFAMETHOXAZOLE-TRIMETHOPRIM 800-160 MG PO TABS: 1.0000 | ORAL_TABLET | Freq: Two times a day (BID) | ORAL | Status: AC

## 2014-07-03 NOTE — ED Provider Notes (Signed)
CSN: [id number]     Arrival date & time 07/03/14  1438 History   First MD Initiated Contact with Patient 07/03/14 1642     Chief Complaint  Patient presents with  . Insect Bite     (Consider location/radiation/quality/duration/timing/severity/associated sxs/prior Treatment) HPI  44 year old female with right proximal thigh redness and itching since yesterday. She's concerned about a spider bite. She did not feel a sting or bite but states she notices at work yesterday. It is not been getting larger since yesterday but it has become more hot. No fevers or chills. Friend stated that she had MRSA that was very similar to this and patient is requesting a MRSA test. She has been using Benadryl ointment with no relief.  Past Medical History  Diagnosis Date  . Hx of myomectomy 09-30-2008    sr did the surgery  . Costochondritis     Acute  . Uterine fibroid   . Swine flu 2009  . Chest pain   . Costochondritis    Past Surgical History  Procedure Laterality Date  . Myomectomy      sr did surgery  . Uterine artery embolization     Family History  Problem Relation Age of Onset  . Hypertension Mother   . Hypertension Brother   . Diabetes Maternal Grandmother   . Diabetes Maternal Aunt   . Diabetes Maternal Aunt   . Diabetes Maternal Aunt    History  Substance Use Topics  . Smoking status: Never Smoker   . Smokeless tobacco: Never Used  . Alcohol Use: No   OB History    Gravida Para Term Preterm AB TAB SAB Ectopic Multiple Living   1 0   1  1   0     Review of Systems  Constitutional: Negative for fever.  Musculoskeletal: Positive for myalgias.  Skin: Positive for color change. Negative for wound.  All other systems reviewed and are negative.     Allergies  Pollen extract  Home Medications   Prior to Admission medications   Medication Sig Start Date End Date Taking? Authorizing Provider  cephALEXin (KEFLEX) 500 MG capsule Take 1 capsule (500 mg total) by mouth 4  (four) times daily. 07/03/14   Sherwood Gambler, MD  cyclobenzaprine (FLEXERIL) 5 MG tablet Take 1 tablet (5 mg total) by mouth at bedtime. 05/27/13   Robyn Haber, MD  diclofenac (VOLTAREN) 75 MG EC tablet Take 1 tablet (75 mg total) by mouth 2 (two) times daily. 05/27/13   Robyn Haber, MD  HYDROcodone-acetaminophen [facility]) 5-325 MG per tablet Take one every 4-6 hours as needed for severe pain 04/21/13   Posey Boyer, MD  ibuprofen (ADVIL,MOTRIN) 800 MG tablet Take 800 mg by mouth every 8 (eight) hours as needed.    Historical Provider, MD  sulfamethoxazole-trimethoprim (BACTRIM DS,SEPTRA DS) 800-160 MG per tablet Take 1 tablet by mouth 2 (two) times daily. 07/03/14 07/10/14  Sherwood Gambler, MD   BP 146/79 mmHg  Pulse 96  Temp(Src) 97.9 F (36.6 C)  Resp 16  Ht 5\' 3"  (1.6 m)  Wt 205 lb (92.987 kg)  BMI 36.32 kg/m2  SpO2 100%  LMP 06/25/2014 Physical Exam  Constitutional: She is oriented to person, place, and time. She appears well-developed and well-nourished.  HENT:  Head: Normocephalic and atraumatic.  Right Ear: External ear normal.  Left Ear: External ear normal.  Nose: Nose normal.  Eyes: Right eye exhibits no discharge. Left eye exhibits no discharge.  Cardiovascular: Normal rate.  Pulmonary/Chest: Effort normal.  Abdominal: She exhibits no distension.  Neurological: She is alert and oriented to person, place, and time.  Skin: Skin is warm and dry. Rash noted. Rash is macular.     Nursing note and vitals reviewed.   ED Course  Procedures (including critical care time) Labs Review Labs Reviewed - No data to display  Imaging Review No results found.   EKG Interpretation None      MDM   Final diagnoses:  Bite of right thigh, initial encounter    I feel that patient's redness is most likely a localized allergic reaction to something. However it is very warm and she is very concerned about MRSA. I feel no palpable abscess. She is hemodynamically stable with no  systemic symptoms. Will cover for infection and recommend using Benadryl for the itching. Follow-up with PCP.    Sherwood Gambler, MD 07/03/14 [phone]

## 2014-07-03 NOTE — ED Notes (Signed)
?  insect bite to right upper thigh-noticed yesterday

## 2014-07-03 NOTE — ED Notes (Signed)
MD at bedside - Dr. Regenia Skeeter

## 2015-01-01 ENCOUNTER — Other Ambulatory Visit: Payer: 59 | Admitting: Obstetrics and Gynecology

## 2015-01-01 DIAGNOSIS — N6002 Solitary cyst of left breast: Secondary | ICD-10-CM

## 2015-01-12 ENCOUNTER — Other Ambulatory Visit: Payer: Self-pay | Admitting: Obstetrics and Gynecology

## 2015-01-12 ENCOUNTER — Ambulatory Visit
Admission: RE | Admit: 2015-01-12 | Discharge: 2015-01-12 | Disposition: A | Discharge status: Home / Self Care | Payer: 59 | Source: Ambulatory Visit | Attending: Obstetrics and Gynecology | Admitting: Obstetrics and Gynecology

## 2015-01-12 DIAGNOSIS — N6310 Unspecified lump in the right breast, unspecified quadrant: Secondary | ICD-10-CM

## 2015-01-12 DIAGNOSIS — N6002 Solitary cyst of left breast: Secondary | ICD-10-CM

## 2015-01-15 ENCOUNTER — Ambulatory Visit
Admission: RE | Admit: 2015-01-15 | Discharge: 2015-01-15 | Disposition: A | Discharge status: Home / Self Care | Payer: 59 | Source: Ambulatory Visit | Attending: Obstetrics and Gynecology | Admitting: Obstetrics and Gynecology

## 2015-01-15 ENCOUNTER — Other Ambulatory Visit: Payer: Self-pay | Admitting: Obstetrics and Gynecology

## 2015-01-15 DIAGNOSIS — N6310 Unspecified lump in the right breast, unspecified quadrant: Secondary | ICD-10-CM

## 2015-01-21 ENCOUNTER — Other Ambulatory Visit: Payer: 59

## 2015-01-22 ENCOUNTER — Other Ambulatory Visit: Payer: Self-pay | Admitting: Family Medicine

## 2015-01-22 DIAGNOSIS — N63 Unspecified lump in unspecified breast: Secondary | ICD-10-CM

## 2015-02-04 ENCOUNTER — Ambulatory Visit
Admission: RE | Admit: 2015-02-04 | Discharge: 2015-02-04 | Disposition: A | Discharge status: Home / Self Care | Payer: 59 | Source: Ambulatory Visit | Attending: Family Medicine | Admitting: Family Medicine

## 2015-02-04 ENCOUNTER — Other Ambulatory Visit: Payer: Self-pay | Admitting: Family Medicine

## 2015-02-04 DIAGNOSIS — N63 Unspecified lump in unspecified breast: Secondary | ICD-10-CM

## 2015-03-22 ENCOUNTER — Other Ambulatory Visit: Payer: Self-pay | Admitting: Physician Assistant

## 2015-03-22 DIAGNOSIS — S8992XA Unspecified injury of left lower leg, initial encounter: Secondary | ICD-10-CM

## 2015-03-25 ENCOUNTER — Ambulatory Visit
Admission: RE | Admit: 2015-03-25 | Discharge: 2015-03-25 | Disposition: A | Discharge status: Home / Self Care | Payer: 59 | Source: Ambulatory Visit | Attending: Physician Assistant | Admitting: Physician Assistant

## 2015-03-25 DIAGNOSIS — S8992XA Unspecified injury of left lower leg, initial encounter: Secondary | ICD-10-CM

## 2015-07-12 ENCOUNTER — Other Ambulatory Visit: Payer: Self-pay | Admitting: General Practice

## 2015-07-12 DIAGNOSIS — S8992XS Unspecified injury of left lower leg, sequela: Secondary | ICD-10-CM

## 2015-07-19 ENCOUNTER — Ambulatory Visit
Admission: RE | Admit: 2015-07-19 | Discharge: 2015-07-19 | Disposition: A | Discharge status: Home / Self Care | Payer: 59 | Source: Ambulatory Visit | Attending: General Practice | Admitting: General Practice

## 2015-07-19 DIAGNOSIS — S8992XS Unspecified injury of left lower leg, sequela: Secondary | ICD-10-CM

## 2015-08-11 ENCOUNTER — Other Ambulatory Visit: Payer: Self-pay | Admitting: Obstetrics and Gynecology

## 2015-08-20 NOTE — H&P (Signed)
[name] is a 45 y.o. Margie Philbrick is a 45 yo female P: 0-0-1-0 who presents for myomectomy because of symptomatic uterine fibroids and menorrhagia.  The patient gives a history of  uterine fibroids that caused menorrhagia in the past and was managed by uterine artery embolization in 2004. Since that time her menstrual flow was what she considered normal until September-November of 2016 when she became amenorrheic.  In December 2016 however,  the patient began bleeding  with clots, for 12 days, requiring #2 overnight pads that were changed every two hours.  Additionally she experienced cramping that she rated 10/10 on a 10 point pain scale.  Since that time her  monthly bleeding  has been the same.  She denies any intermenstrual bleeding but has noticed urinary frequency, lower back pain and occasional discomfort with urination.  Patient is not sexually active.  In the past, for menorrhagia the patient underwent a myomectomy and used oral contraceptives but her results were short-lived.  Recently Aygestin 5 mg decreased her cramping significantly and her flow to 7 days but no change in volume.    A TSH during this current evaluation was normal and hemoglobin/hematocrit were 11.5/36.0 respectively.  A pelvic ultrasound in March 2017 revealed a multi-fibroid uterus, #8 measured with the uterus measuring: 15.03 x 7.91 cm;  #8 intramural fibroids: 4.56 x 4.12 x 4.71 cm;   5.78 x 4.12 x 5.04 cm,  3.93 x 2.86 x 3.45 cm;  3.14 x 2.31 x 2.74 cm; 4.23 x 4.07 x 3.79 cm, 2.81 x 2.33 x 2.16 cm,   1.83 x 0.96 x 1.33 cm, and 2.79 x 2.68 x 2.63 cm;  #1 sub-mucosal fibroid measured: 4.2 x 4.0 x 3.1 cm. Ovaries appeared normal;  right-3.04 cm and left-3.09 cm.  A review of both medical and surgical management options were given to the patient however, she wants to proceed with myomectomy for management.   Past Medical History  OB History: G:1;  P: 0-0-1-0  GYN History: menarche: 45 YO    LMP: 07/21/15    Contracepton  abstinence  The patient denies history of sexually transmitted disease.  Denies history of abnormal PAP smear.   Last PAP smear: 2017-normal  Medical History: Costochondritis, Vitamin D Deficiency and Right Breast Fibroadenoma  Surgical History: 2004 Uterine Artery Embolization;   2010 Myomectomy and 2016 Right Breast Biopsy Denies problems with anesthesia  but has a history of blood transfusions in 2004   Family History: Diabetes Mellitus,  Hypertension, Cardiovascular Disease, Asthma and Rheumatoid Arthritis   Social History: Divorced; Employed as a Education officer, museum;  Denies tobacco or alcohol use  Medications:  Micro-gestin 1/20  1  po daily   Allergies  Allergen Reactions  . Pollen Extract Itching    Pollen make pt eyes itch.  lm    Denies sensitivity to peanuts, shellfish, soy, latex or adhesives.  ROS: Admits to glasses,  post void dribbling, urinary discomfort on occasion, urinary frequency chest wall pain with costochondritis and left knee pain;  Denies headache, vision changes, nasal congestion, dysphagia, tinnitus, dizziness, hoarseness, cough,  chest pain, shortness of breath, nausea, vomiting, diarrhea,constipation,  urgency, hematuria, vaginitis symptoms, pelvic pain, swelling of joints,easy bruising,  myalgias,  skin rashes, unexplained weight loss and except as is mentioned in the history of present illness, patient's review of systems is otherwise negative.    Physical Exam  Bp: 134/80       P: 88 bpm   R: 18  Temperature: 98.7 degrees F orally     Weight: 194 lbs.  Height: 5\' 5"   Neck: supple without masses or thyromegaly Lungs: clear to auscultation Heart: regular rate and rhythm Abdomen: firm mass from pelvis to the level of the umbilicus  and no organomegaly Pelvic:EGBUS- wnl; vagina-normal rugae; uterus-enlarged and irregular, 18-20 weeks size, cervix without lesions or motion tenderness; adnexae-no tenderness or separable masses (uterus extends into  adnexae) Extremities:  no clubbing, cyanosis or edema   Assesment:  Symptomatic Uterine Fibroids   Disposition:  A discussion was held with patient regarding the indication for her procedure(s) along with the risks, which include but are not limited to: reaction to anesthesia, damage to adjacent organs, infection,  excessive bleeding and if the endometrial cavity is breached,  a C-section delivery would be needed.    The patient verbalized understanding of these risks and has consented to proceed with an Abdominal Myomectomy at [facility] on September 08, 2015.   CSN# [id number]   Alyria Krack J. Florene Glen, PA-C  for Dr.  Dede Query. Rivard

## 2015-08-26 NOTE — Patient Instructions (Signed)
Your procedure is scheduled on:  Wednesday, September 08, 2015  Enter through the [employer] [facility] at:  7:00 AM  Pick up the phone at the desk and dial [phone].  Call this number if you have problems the morning of surgery: [phone].  Remember: Do NOT eat food or drink after:  Midnight Tuesday, September 07, 2015  Take these medicines the morning of surgery with a SIP OF WATER:  None  Do NOT wear jewelry (body piercing), metal hair clips/bobby pins, make-up, or nail polish. Do NOT wear lotions, powders, or perfumes.  You may wear deodorant. Do NOT shave for 48 hours prior to surgery. Do NOT bring valuables to the hospital. Contacts, dentures, or bridgework may not be worn into surgery.  Leave suitcase in car.  After surgery it may be brought to your room.  For patients admitted to the hospital, checkout time is 11:00 AM the day of discharge.

## 2015-08-27 ENCOUNTER — Encounter (HOSPITAL_COMMUNITY): Payer: Self-pay

## 2015-08-27 ENCOUNTER — Encounter (HOSPITAL_COMMUNITY)
Admission: RE | Admit: 2015-08-27 | Discharge: 2015-08-27 | Disposition: A | Discharge status: Home / Self Care | Payer: 59 | Source: Ambulatory Visit | Attending: Obstetrics and Gynecology | Admitting: Obstetrics and Gynecology

## 2015-08-27 DIAGNOSIS — Z01812 Encounter for preprocedural laboratory examination: Secondary | ICD-10-CM | POA: Insufficient documentation

## 2015-08-27 HISTORY — DX: Anemia, unspecified: D64.9

## 2015-08-27 HISTORY — DX: Chondrocostal junction syndrome (tietze): M94.0

## 2015-08-27 HISTORY — DX: Other tear of unspecified meniscus, current injury, unspecified knee, sequela: S83.205S

## 2015-08-27 LAB — BASIC METABOLIC PANEL
Anion gap: 7 (ref 5–15)
BUN: 10 mg/dL (ref 6–20)
CALCIUM: 9.2 mg/dL (ref 8.9–10.3)
CO2: 22 mmol/L (ref 22–32)
CREATININE: 0.77 mg/dL (ref 0.44–1.00)
Chloride: 104 mmol/L (ref 101–111)
GFR calc non Af Amer: >60 mL/min (ref >60)
Glucose, Bld: 119 mg/dL — ABNORMAL HIGH (ref 65–99)
Potassium: 4 mmol/L (ref 3.5–5.1)
Sodium: 133 mmol/L — ABNORMAL LOW (ref 135–145)

## 2015-08-27 LAB — CBC
HEMATOCRIT: 31.6 % — AB (ref 36.0–46.0)
Hemoglobin: 10.2 g/dL — ABNORMAL LOW (ref 12.0–15.0)
MCH: 25.8 pg — ABNORMAL LOW (ref 26.0–34.0)
MCHC: 32.3 g/dL (ref 30.0–36.0)
MCV: 79.8 fL (ref 78.0–100.0)
Platelets: 307 10*3/uL (ref 150–400)
RBC: 3.96 MIL/uL (ref 3.87–5.11)
RDW: 13.9 % (ref 11.5–15.5)
WBC: 6.7 10*3/uL (ref 4.0–10.5)

## 2015-08-31 DIAGNOSIS — M23307 Other meniscus derangements, unspecified meniscus, left knee: Secondary | ICD-10-CM | POA: Insufficient documentation

## 2015-09-07 MED ORDER — DEXTROSE 5 % IV SOLN
2.0000 g | INTRAVENOUS | Status: AC
  Administered 2015-09-08: 2 g via INTRAVENOUS
  Filled 2015-09-07: qty 2

## 2015-09-07 NOTE — Anesthesia Preprocedure Evaluation (Addendum)
Anesthesia Evaluation  Patient identified by MRN, date of birth, ID band Patient awake    Reviewed: Allergy & Precautions, NPO status , Patient's Chart, lab work & pertinent test results  Airway Mallampati: II  TM Distance: >3 FB Neck ROM: Full    Dental  (+) Teeth Intact   Pulmonary neg pulmonary ROS,    breath sounds clear to auscultation       Cardiovascular negative cardio ROS   Rhythm:Regular Rate:Normal     Neuro/Psych negative neurological ROS  negative psych ROS   GI/Hepatic negative GI ROS, Neg liver ROS,   Endo/Other  negative endocrine ROS  Renal/GU negative Renal ROS  negative genitourinary   Musculoskeletal negative musculoskeletal ROS (+)   Abdominal   Peds negative pediatric ROS (+)  Hematology negative hematology ROS (+)   Anesthesia Other Findings - Uterine Fibroid  Reproductive/Obstetrics negative OB ROS                            Lab Results  Component Value Date   WBC 6.7 08/27/2015   HGB 10.2 (L) 08/27/2015   HCT 31.6 (L) 08/27/2015   MCV 79.8 08/27/2015   PLT 307 08/27/2015   Lab Results  Component Value Date   CREATININE 0.77 08/27/2015   BUN 10 08/27/2015   NA 133 (L) 08/27/2015   K 4.0 08/27/2015   CL 104 08/27/2015   CO2 22 08/27/2015   No results found for: INR, PROTIME   Anesthesia Physical Anesthesia Plan  ASA: II  Anesthesia Plan: General   Post-op Pain Management:    Induction: Intravenous  Airway Management Planned: Oral ETT  Additional Equipment:   Intra-op Plan:   Post-operative Plan: Extubation in OR  Informed Consent: I have reviewed the patients History and Physical, chart, labs and discussed the procedure including the risks, benefits and alternatives for the proposed anesthesia with the patient or authorized representative who has indicated his/her understanding and acceptance.   Dental advisory given  Plan Discussed  with: CRNA  Anesthesia Plan Comments:         Anesthesia Quick Evaluation

## 2015-09-08 ENCOUNTER — Inpatient Hospital Stay (HOSPITAL_COMMUNITY)
Admission: RE | Admit: 2015-09-08 | Discharge: 2015-09-10 | DRG: 743 | Disposition: A | Discharge status: Home / Self Care | Payer: 59 | Source: Ambulatory Visit | Attending: Obstetrics and Gynecology | Admitting: Obstetrics and Gynecology

## 2015-09-08 ENCOUNTER — Inpatient Hospital Stay (HOSPITAL_COMMUNITY): Payer: 59 | Admitting: Anesthesiology

## 2015-09-08 ENCOUNTER — Encounter (HOSPITAL_COMMUNITY)
Admission: RE | Disposition: A | Discharge status: Home / Self Care | Payer: Self-pay | Source: Ambulatory Visit | Attending: Obstetrics and Gynecology

## 2015-09-08 ENCOUNTER — Encounter (HOSPITAL_COMMUNITY): Payer: Self-pay | Admitting: *Deleted

## 2015-09-08 DIAGNOSIS — D250 Submucous leiomyoma of uterus: Principal | ICD-10-CM | POA: Diagnosis present

## 2015-09-08 DIAGNOSIS — D251 Intramural leiomyoma of uterus: Secondary | ICD-10-CM | POA: Diagnosis present

## 2015-09-08 DIAGNOSIS — D649 Anemia, unspecified: Secondary | ICD-10-CM | POA: Diagnosis present

## 2015-09-08 DIAGNOSIS — D259 Leiomyoma of uterus, unspecified: Secondary | ICD-10-CM | POA: Diagnosis present

## 2015-09-08 DIAGNOSIS — D219 Benign neoplasm of connective and other soft tissue, unspecified: Secondary | ICD-10-CM | POA: Diagnosis present

## 2015-09-08 DIAGNOSIS — N920 Excessive and frequent menstruation with regular cycle: Secondary | ICD-10-CM | POA: Diagnosis present

## 2015-09-08 HISTORY — PX: MYOMECTOMY: SHX85

## 2015-09-08 LAB — RAPID HIV SCREEN (HIV 1/2 AB+AG)
HIV 1/2 ANTIBODIES: NONREACTIVE
HIV-1 P24 ANTIGEN - HIV24: NONREACTIVE

## 2015-09-08 LAB — CBC
HCT: 23.2 % — ABNORMAL LOW (ref 36.0–46.0)
HEMOGLOBIN: 7.7 g/dL — AB (ref 12.0–15.0)
MCH: 25.9 pg — AB (ref 26.0–34.0)
MCHC: 33.2 g/dL (ref 30.0–36.0)
MCV: 78.1 fL (ref 78.0–100.0)
Platelets: 199 10*3/uL (ref 150–400)
RBC: 2.97 MIL/uL — ABNORMAL LOW (ref 3.87–5.11)
RDW: 13.7 % (ref 11.5–15.5)
WBC: 15.6 10*3/uL — ABNORMAL HIGH (ref 4.0–10.5)

## 2015-09-08 LAB — PREPARE RBC (CROSSMATCH)

## 2015-09-08 LAB — HEMOGLOBIN AND HEMATOCRIT, BLOOD
HCT: 17.3 % — ABNORMAL LOW (ref 36.0–46.0)
Hemoglobin: 5.9 g/dL — CL (ref 12.0–15.0)

## 2015-09-08 LAB — PREGNANCY, URINE: Preg Test, Ur: NEGATIVE

## 2015-09-08 LAB — HEPATITIS C ANTIBODY: HCV Ab: <0.1 s/co ratio (ref 0.0–0.9)

## 2015-09-08 LAB — HEPATITIS B SURFACE ANTIGEN: HEP B S AG: NEGATIVE

## 2015-09-08 SURGERY — MYOMECTOMY, ABDOMINAL APPROACH
Anesthesia: General | Site: Abdomen

## 2015-09-08 MED ORDER — FENTANYL CITRATE (PF) 100 MCG/2ML IJ SOLN
INTRAMUSCULAR | Status: AC
  Filled 2015-09-08: qty 2

## 2015-09-08 MED ORDER — ROCURONIUM BROMIDE 100 MG/10ML IV SOLN
INTRAVENOUS | Status: AC
  Filled 2015-09-08: qty 1

## 2015-09-08 MED ORDER — DIPHENHYDRAMINE HCL 50 MG/ML IJ SOLN
25.0000 mg | Freq: Once | INTRAMUSCULAR | Status: AC
  Administered 2015-09-08: 25 mg via INTRAVENOUS
  Filled 2015-09-08: qty 1

## 2015-09-08 MED ORDER — MIDAZOLAM HCL 2 MG/2ML IJ SOLN
INTRAMUSCULAR | Status: DC | PRN
  Administered 2015-09-08: 2 mg via INTRAVENOUS

## 2015-09-08 MED ORDER — MIDAZOLAM HCL 2 MG/2ML IJ SOLN
INTRAMUSCULAR | Status: AC
  Filled 2015-09-08: qty 2

## 2015-09-08 MED ORDER — LACTATED RINGERS IV SOLN
INTRAVENOUS | Status: DC
  Administered 2015-09-08 (×6): via INTRAVENOUS

## 2015-09-08 MED ORDER — HYDROCODONE-ACETAMINOPHEN 5-325 MG PO TABS: 1.0000 | ORAL_TABLET | ORAL | Status: DC | PRN

## 2015-09-08 MED ORDER — FENTANYL CITRATE (PF) 250 MCG/5ML IJ SOLN
INTRAMUSCULAR | Status: AC
  Filled 2015-09-08: qty 5

## 2015-09-08 MED ORDER — KETOROLAC TROMETHAMINE 30 MG/ML IJ SOLN
30.0000 mg | Freq: Four times a day (QID) | INTRAMUSCULAR | Status: AC
  Administered 2015-09-08 – 2015-09-09 (×3): 30 mg via INTRAVENOUS
  Filled 2015-09-08 (×3): qty 1

## 2015-09-08 MED ORDER — FUROSEMIDE 10 MG/ML IJ SOLN
20.0000 mg | Freq: Once | INTRAMUSCULAR | Status: AC
  Administered 2015-09-08: 20 mg via INTRAVENOUS
  Filled 2015-09-08: qty 2

## 2015-09-08 MED ORDER — SODIUM CHLORIDE 0.9 % IJ SOLN
INTRAMUSCULAR | Status: AC
  Filled 2015-09-08: qty 100

## 2015-09-08 MED ORDER — HYDROMORPHONE HCL 1 MG/ML IJ SOLN
0.2500 mg | INTRAMUSCULAR | Status: DC | PRN
  Administered 2015-09-08 (×4): 0.5 mg via INTRAVENOUS

## 2015-09-08 MED ORDER — BUPIVACAINE HCL (PF) 0.25 % IJ SOLN
INTRAMUSCULAR | Status: AC
  Filled 2015-09-08: qty 30

## 2015-09-08 MED ORDER — ALBUMIN HUMAN 5 % IV SOLN
12.5000 g | Freq: Once | INTRAVENOUS | Status: AC
  Administered 2015-09-08: 12.5 g via INTRAVENOUS
  Filled 2015-09-08: qty 250

## 2015-09-08 MED ORDER — VASOPRESSIN 20 UNIT/ML IV SOLN
INTRAVENOUS | Status: DC | PRN
  Administered 2015-09-08: 10 mL via INTRAMUSCULAR
  Administered 2015-09-08: 5 mL via INTRAMUSCULAR
  Administered 2015-09-08: 7 mL via INTRAMUSCULAR
  Administered 2015-09-08: 10 mL via INTRAMUSCULAR
  Administered 2015-09-08 (×2): 6 mL via INTRAMUSCULAR
  Administered 2015-09-08: 2 mL via INTRAMUSCULAR
  Administered 2015-09-08: 5 mL via INTRAMUSCULAR
  Administered 2015-09-08: 8 mL via INTRAMUSCULAR
  Administered 2015-09-08: 10 mL via INTRAMUSCULAR
  Administered 2015-09-08: 8 mL via INTRAMUSCULAR

## 2015-09-08 MED ORDER — PROPOFOL 10 MG/ML IV BOLUS
INTRAVENOUS | Status: DC | PRN
  Administered 2015-09-08: 200 mg via INTRAVENOUS
  Administered 2015-09-08: 50 mg via INTRAVENOUS

## 2015-09-08 MED ORDER — EPHEDRINE 5 MG/ML INJ
INTRAVENOUS | Status: AC
  Filled 2015-09-08: qty 10

## 2015-09-08 MED ORDER — NEOSTIGMINE METHYLSULFATE 10 MG/10ML IV SOLN
INTRAVENOUS | Status: AC
  Filled 2015-09-08: qty 1

## 2015-09-08 MED ORDER — MENTHOL 3 MG MT LOZG: 1.0000 | LOZENGE | OROMUCOSAL | Status: DC | PRN

## 2015-09-08 MED ORDER — SODIUM CHLORIDE 0.9% FLUSH: 9.0000 mL | INTRAVENOUS | Status: DC | PRN

## 2015-09-08 MED ORDER — METHYLENE BLUE 0.5 % INJ SOLN
INTRAVENOUS | Status: AC
  Filled 2015-09-08: qty 10

## 2015-09-08 MED ORDER — GLYCOPYRROLATE 0.2 MG/ML IJ SOLN
INTRAMUSCULAR | Status: AC
  Filled 2015-09-08: qty 3

## 2015-09-08 MED ORDER — ONDANSETRON HCL 4 MG/2ML IJ SOLN
INTRAMUSCULAR | Status: AC
Start: 2015-09-08 — End: 2015-09-08
  Filled 2015-09-08: qty 2

## 2015-09-08 MED ORDER — HYDROMORPHONE HCL 1 MG/ML IJ SOLN
INTRAMUSCULAR | Status: AC
  Filled 2015-09-08: qty 1

## 2015-09-08 MED ORDER — NALOXONE HCL 0.4 MG/ML IJ SOLN: 0.4000 mg | INTRAMUSCULAR | Status: DC | PRN

## 2015-09-08 MED ORDER — FENTANYL CITRATE (PF) 100 MCG/2ML IJ SOLN
INTRAMUSCULAR | Status: DC | PRN
Start: 2015-09-08 — End: 2015-09-08
  Administered 2015-09-08: 50 ug via INTRAVENOUS
  Administered 2015-09-08: 100 ug via INTRAVENOUS
  Administered 2015-09-08 (×2): 50 ug via INTRAVENOUS
  Administered 2015-09-08: 100 ug via INTRAVENOUS
  Administered 2015-09-08 (×2): 50 ug via INTRAVENOUS
  Administered 2015-09-08: 100 ug via INTRAVENOUS

## 2015-09-08 MED ORDER — ONDANSETRON HCL 4 MG PO TABS: 4.0000 mg | ORAL_TABLET | Freq: Three times a day (TID) | ORAL | Status: DC | PRN

## 2015-09-08 MED ORDER — DIPHENHYDRAMINE HCL 12.5 MG/5ML PO ELIX: 12.5000 mg | ORAL_SOLUTION | Freq: Four times a day (QID) | ORAL | Status: DC | PRN

## 2015-09-08 MED ORDER — ONDANSETRON HCL 4 MG/2ML IJ SOLN
INTRAMUSCULAR | Status: DC | PRN
  Administered 2015-09-08: 4 mg via INTRAVENOUS

## 2015-09-08 MED ORDER — DEXAMETHASONE SODIUM PHOSPHATE 4 MG/ML IJ SOLN
INTRAMUSCULAR | Status: AC
  Filled 2015-09-08: qty 1

## 2015-09-08 MED ORDER — LACTATED RINGERS IV SOLN
INTRAVENOUS | Status: DC
  Administered 2015-09-08: 125 mL/h via INTRAVENOUS

## 2015-09-08 MED ORDER — KETOROLAC TROMETHAMINE 30 MG/ML IJ SOLN
INTRAMUSCULAR | Status: AC
  Filled 2015-09-08: qty 2

## 2015-09-08 MED ORDER — ONDANSETRON HCL 4 MG/2ML IJ SOLN: 4.0000 mg | Freq: Four times a day (QID) | INTRAMUSCULAR | Status: DC | PRN

## 2015-09-08 MED ORDER — DIPHENHYDRAMINE HCL 50 MG/ML IJ SOLN: 12.5000 mg | Freq: Four times a day (QID) | INTRAMUSCULAR | Status: DC | PRN

## 2015-09-08 MED ORDER — HYDROMORPHONE HCL 1 MG/ML IJ SOLN
INTRAMUSCULAR | Status: AC
  Administered 2015-09-08: 0.5 mg via INTRAVENOUS
  Filled 2015-09-08: qty 1

## 2015-09-08 MED ORDER — KETOROLAC TROMETHAMINE 30 MG/ML IJ SOLN
INTRAMUSCULAR | Status: DC | PRN
  Administered 2015-09-08: 30 mg via INTRAMUSCULAR
  Administered 2015-09-08: 30 mg via INTRAVENOUS

## 2015-09-08 MED ORDER — HYDROCODONE-ACETAMINOPHEN 5-325 MG PO TABS: 2.0000 | ORAL_TABLET | ORAL | Status: DC | PRN

## 2015-09-08 MED ORDER — OXYCODONE-ACETAMINOPHEN 5-325 MG PO TABS
1.0000 | ORAL_TABLET | ORAL | Status: DC | PRN
  Administered 2015-09-08 – 2015-09-10 (×7): 2 via ORAL
  Filled 2015-09-08 (×7): qty 2

## 2015-09-08 MED ORDER — DOCUSATE SODIUM 100 MG PO CAPS
100.0000 mg | ORAL_CAPSULE | Freq: Two times a day (BID) | ORAL | Status: DC
  Administered 2015-09-09 (×2): 100 mg via ORAL
  Filled 2015-09-08 (×2): qty 1

## 2015-09-08 MED ORDER — PROMETHAZINE HCL 25 MG/ML IJ SOLN: 6.2500 mg | INTRAMUSCULAR | Status: DC | PRN

## 2015-09-08 MED ORDER — SODIUM CHLORIDE 0.9 % IV SOLN: Freq: Once | INTRAVENOUS | Status: DC

## 2015-09-08 MED ORDER — LIDOCAINE HCL (CARDIAC) 20 MG/ML IV SOLN
INTRAVENOUS | Status: DC | PRN
  Administered 2015-09-08: 100 mg via INTRAVENOUS
  Administered 2015-09-08: 50 mg via INTRAVENOUS

## 2015-09-08 MED ORDER — SCOPOLAMINE 1 MG/3DAYS TD PT72
MEDICATED_PATCH | TRANSDERMAL | Status: AC
  Administered 2015-09-08: 1.5 mg via TRANSDERMAL
  Filled 2015-09-08: qty 1

## 2015-09-08 MED ORDER — NEOSTIGMINE METHYLSULFATE 10 MG/10ML IV SOLN
INTRAVENOUS | Status: DC | PRN
  Administered 2015-09-08: 4 mg via INTRAVENOUS

## 2015-09-08 MED ORDER — SODIUM CHLORIDE 0.9% FLUSH
INTRAVENOUS | Status: AC
  Filled 2015-09-08: qty 3

## 2015-09-08 MED ORDER — LIDOCAINE HCL (CARDIAC) 20 MG/ML IV SOLN
INTRAVENOUS | Status: AC
  Filled 2015-09-08: qty 5

## 2015-09-08 MED ORDER — GLYCOPYRROLATE 0.2 MG/ML IJ SOLN
INTRAMUSCULAR | Status: DC | PRN
  Administered 2015-09-08: 0.6 mg via INTRAVENOUS

## 2015-09-08 MED ORDER — VASOPRESSIN 20 UNIT/ML IV SOLN
INTRAVENOUS | Status: AC
  Filled 2015-09-08: qty 1

## 2015-09-08 MED ORDER — LACTATED RINGERS IV SOLN
INTRAVENOUS | Status: DC
  Administered 2015-09-09: 10:00:00 via INTRAVENOUS

## 2015-09-08 MED ORDER — SCOPOLAMINE 1 MG/3DAYS TD PT72
1.0000 | MEDICATED_PATCH | Freq: Once | TRANSDERMAL | Status: DC
  Administered 2015-09-08: 1.5 mg via TRANSDERMAL

## 2015-09-08 MED ORDER — EPHEDRINE SULFATE 50 MG/ML IJ SOLN
INTRAMUSCULAR | Status: DC | PRN
  Administered 2015-09-08 (×5): 5 mg via INTRAVENOUS

## 2015-09-08 MED ORDER — LACTATED RINGERS IV BOLUS (SEPSIS)
500.0000 mL | Freq: Once | INTRAVENOUS | Status: AC
  Administered 2015-09-08: 500 mL via INTRAVENOUS

## 2015-09-08 MED ORDER — PROPOFOL 10 MG/ML IV BOLUS
INTRAVENOUS | Status: AC
  Filled 2015-09-08: qty 40

## 2015-09-08 MED ORDER — LACTATED RINGERS IV SOLN: INTRAVENOUS | Status: DC

## 2015-09-08 MED ORDER — HYDROMORPHONE 1 MG/ML IV SOLN
INTRAVENOUS | Status: DC
  Administered 2015-09-08: 0.6 mg via INTRAVENOUS
  Administered 2015-09-08: 19:00:00 via INTRAVENOUS
  Filled 2015-09-08: qty 25

## 2015-09-08 MED ORDER — DEXAMETHASONE SODIUM PHOSPHATE 4 MG/ML IJ SOLN
INTRAMUSCULAR | Status: DC | PRN
  Administered 2015-09-08: 4 mg via INTRAVENOUS

## 2015-09-08 MED ORDER — BUPIVACAINE HCL (PF) 0.25 % IJ SOLN
INTRAMUSCULAR | Status: DC | PRN
  Administered 2015-09-08: 20 mL

## 2015-09-08 MED ORDER — MEPERIDINE HCL 25 MG/ML IJ SOLN: 6.2500 mg | INTRAMUSCULAR | Status: DC | PRN

## 2015-09-08 MED ORDER — IBUPROFEN 600 MG PO TABS: 600.0000 mg | ORAL_TABLET | Freq: Four times a day (QID) | ORAL | Status: DC | PRN

## 2015-09-08 MED ORDER — ROCURONIUM BROMIDE 100 MG/10ML IV SOLN
INTRAVENOUS | Status: DC | PRN
  Administered 2015-09-08: 20 mg via INTRAVENOUS
  Administered 2015-09-08: 50 mg via INTRAVENOUS

## 2015-09-08 SURGICAL SUPPLY — 51 items
ADAPTER CATH SYR TO TUBING 38M (ADAPTER) IMPLANT
BARRIER ADHS 3X4 INTERCEED (GAUZE/BANDAGES/DRESSINGS) ×6 IMPLANT
BENZOIN TINCTURE PRP APPL 2/3 (GAUZE/BANDAGES/DRESSINGS) ×2 IMPLANT
BINDER ABDOMINAL 12 ML 46-62 (SOFTGOODS) ×2 IMPLANT
CANISTER SUCT 3000ML (MISCELLANEOUS) ×2 IMPLANT
CATH FOLEY 2WAY  3CC  8FR (CATHETERS)
CATH FOLEY 2WAY 3CC 8FR (CATHETERS) IMPLANT
CELLS DAT CNTRL 66122 CELL SVR (MISCELLANEOUS) IMPLANT
CLOTH BEACON ORANGE TIMEOUT ST (SAFETY) ×2 IMPLANT
CONT PATH 16OZ SNAP LID 3702 (MISCELLANEOUS) ×2 IMPLANT
DECANTER SPIKE VIAL GLASS SM (MISCELLANEOUS) ×2 IMPLANT
DRAPE CESAREAN BIRTH W POUCH (DRAPES) ×2 IMPLANT
DRSG OPSITE POSTOP 4X10 (GAUZE/BANDAGES/DRESSINGS) ×2 IMPLANT
DURAPREP 26ML APPLICATOR (WOUND CARE) ×2 IMPLANT
ELECT NEEDLE TIP 2.8 STRL (NEEDLE) ×2 IMPLANT
GAUZE SPONGE 4X4 16PLY XRAY LF (GAUZE/BANDAGES/DRESSINGS) ×2 IMPLANT
GLOVE BIOGEL PI IND STRL 7.0 (GLOVE) ×2 IMPLANT
GLOVE BIOGEL PI INDICATOR 7.0 (GLOVE) ×2
GLOVE ECLIPSE 6.5 STRL STRAW (GLOVE) ×2 IMPLANT
GOWN STRL REUS W/TWL LRG LVL3 (GOWN DISPOSABLE) ×6 IMPLANT
HEMOSTAT SURGICEL 4X8 (HEMOSTASIS) IMPLANT
NEEDLE HYPO 22GX1.5 SAFETY (NEEDLE) ×2 IMPLANT
NS IRRIG 1000ML POUR BTL (IV SOLUTION) ×2 IMPLANT
PACK ABDOMINAL GYN (CUSTOM PROCEDURE TRAY) ×2 IMPLANT
PAD OB MATERNITY 4.3X12.25 (PERSONAL CARE ITEMS) ×2 IMPLANT
PROTECTOR NERVE ULNAR (MISCELLANEOUS) ×4 IMPLANT
RETRACTOR WND ALEXIS 25 LRG (MISCELLANEOUS) ×1 IMPLANT
RTRCTR WOUND ALEXIS 18CM MED (MISCELLANEOUS)
RTRCTR WOUND ALEXIS 25CM LRG (MISCELLANEOUS) ×2
SPONGE GAUZE 4X4 12PLY STER LF (GAUZE/BANDAGES/DRESSINGS) ×4 IMPLANT
SPONGE LAP 18X18 X RAY DECT (DISPOSABLE) ×2 IMPLANT
STRIP CLOSURE SKIN 1/2X4 (GAUZE/BANDAGES/DRESSINGS) ×2 IMPLANT
SUT DVC VLOC 180 0 12IN GS21 (SUTURE) ×12
SUT DVC VLOC 180 2-0 12IN GS21 (SUTURE) ×6
SUT MNCRL AB 3-0 PS2 27 (SUTURE) ×2 IMPLANT
SUT VIC AB 0 CT1 27 (SUTURE) ×6
SUT VIC AB 0 CT1 27XBRD ANBCTR (SUTURE) ×6 IMPLANT
SUT VIC AB 1 CT1 36 (SUTURE) ×2 IMPLANT
SUT VIC AB 2-0 CT1 27 (SUTURE) ×5
SUT VIC AB 2-0 CT1 TAPERPNT 27 (SUTURE) ×5 IMPLANT
SUT VIC AB 3-0 SH 27 (SUTURE) ×1
SUT VIC AB 3-0 SH 27X BRD (SUTURE) ×1 IMPLANT
SUTURE DVC VL 180 2-0 12INGS21 (SUTURE) ×3 IMPLANT
SUTURE DVC VLC 180 0 12IN GS21 (SUTURE) ×6 IMPLANT
SYR 30ML LL (SYRINGE) IMPLANT
SYR CONTROL 10ML LL (SYRINGE) ×2 IMPLANT
TOWEL OR 17X24 6PK STRL BLUE (TOWEL DISPOSABLE) ×4 IMPLANT
TRAY FOLEY CATH SILVER 14FR (SET/KITS/TRAYS/PACK) ×2 IMPLANT
TUBING CONNECTING 10 (TUBING) ×2 IMPLANT
WATER STERILE IRR 1000ML POUR (IV SOLUTION) ×2 IMPLANT
YANKAUER SUCT BULB TIP NO VENT (SUCTIONS) ×2 IMPLANT

## 2015-09-08 NOTE — Interval H&P Note (Signed)
History and Physical Interval Note:  09/08/2015 8:35 AM  [name]  has presented today for surgery, with the diagnosis of Symptomatic Uterine Fibroids, Anemia status post uterine artery embolization ans previous Myomectomy  The various methods of treatment have been discussed with the patient and family. After consideration of risks, benefits and other options for treatment, the patient has consented to  Procedure(s): ABDOMINAL MYOMECTOMY (N/A) as a surgical intervention .  The patient's history has been reviewed, patient examined, no change in status, stable for surgery.  I have reviewed the patient's chart and labs.  Questions were answered to the patient's satisfaction.     Cathren Sween A

## 2015-09-08 NOTE — OR Nursing (Signed)
Pt mother updated by phone per Dr. Cletis Media

## 2015-09-08 NOTE — Transfer of Care (Signed)
Immediate Anesthesia Transfer of Care Note  Patient: [name]  Procedure(s) Performed: Procedure(s): ABDOMINAL MYOMECTOMY (N/A)  Patient Location: PACU  Anesthesia Type:General  Level of Consciousness: awake and alert   Airway & Oxygen Therapy: Patient Spontanous Breathing and Patient connected to nasal cannula oxygen  Post-op Assessment: Report given to RN and Post -op Vital signs reviewed and stable  Post vital signs: Reviewed and stable  Last Vitals:  Vitals:   09/08/15 0714  BP: (!) 133/94  Pulse: 86  Resp: 18  Temp: 36.7 C    Last Pain:  Vitals:   09/08/15 0714  TempSrc: Oral      Patients Stated Pain Goal: 3 ([id number])  Complications: No apparent anesthesia complications

## 2015-09-08 NOTE — Op Note (Signed)
Preoperative diagnosis: Uterine fibroids  Postoperative diagnosis: Same  Anesthesia: General  Anesthesiologist: Dr. Smith Robert  Procedure: Abdominal myomectomy  Surgeon: Dr. Cletis Media  Asst.: Earnstine Regal PA-C  Estimated blood loss: 1100 cc  Procedure:  After being informed of the planned procedure with possible complications including but not limited to bleeding, infection, injury to other organs, informed consent is obtained and patient is taken to or #4. She is given general anesthesia with endotracheal intubation without complications.She is placed in the dorsal decubitus position, prepped and draped in a sterile fashion. A Foley catheter is inserted in her bladder.  We infiltrate the existing midline scar area using 20 cc of Marcaine 0.25 and perform a vertical  incision from pubis to umbilicus while removing the old scar. The icicsion w is brought down sharply to the fascia. The fascia is incised in a midline fashion. Linea alba is dissected and peritoneum is entered bluntly. The uterus is exteriorized with difficulty due to anterior adhesions which are dissected sharply.  Observation: The uterus is enlarged to 22 weeks in size with multiple fibroids. Both tubes and ovaries are normal.    We proceed with systematic excision of all fibroids previously described and some more. A total of 39 fibroids are removed through 4 incisions: anterior, posterior, left lateral and low anterior cervical. The anterior incision reaches the endometrial cavity..  All incisions are pre-infiltrated with vasopressine 20 units / 100 cc saline.  All myometrial defects are closed with multiple layers of running 0 V-Lock suturesl. All serosal defects are closed with 2-0 V-Lock baseball sutures.   We irrigated profusely with warm saline and note satisfactory hemostasis. 3 sheet(s) of Interceed are used to wrap the whole uterus.  All sponges and retractors are removed. Under fascia hemostasis is completed with  cauterization.The fascia is closed with 2 running sutures of 1 Vicryl meeting midline. The wound is irrigated with warm saline and hemostasis is completed with cauterization. The subcutaneous tissue is closed with interrupted sutures of 0 Plain.The skin is closed with a subcuticular suture of 3-0 Monocryl and Steri-Strips.  Instruments and sponge count is complete 2.   Estimated blood loss is 1100 cc.  The procedure is well tolerated by the patient is taken to recovery room in a well and stable condition.  Specimen: 39 fibroids weighing 400 g sent to pathology

## 2015-09-08 NOTE — Progress Notes (Signed)
Day of Surgery Procedure(s) (LRB): ABDOMINAL MYOMECTOMY (N/A)  Subjective: Alert and smiling Patient reports that pain is well managed. Is more bothered by right hand IV Tolerating clear liquids  diet without difficulty. No nausea / vomiting.    Objective: BP 130/83 (BP Location: Right Arm)   Pulse (!) 128   Temp 97 F (36.1 C) (Oral)   Resp 13   LMP 08/15/2015 (Exact Date)   SpO2 100%   Abdomen:soft and appropriately tender Extremities: Homans sign is negative, no sign of DVT  Limited urine output  Assessment: s/p Procedure(s): ABDOMINAL MYOMECTOMY: stable  Plan: LR bolus with strict I/O Reviewed possible need for transfusion: patient requesting to postpone CBC to morning. May initiate earlier if urine output remains borderline Operative findings and peri-operative blood loss discussed, questions answered  LOS: 0 days    Charleene Callegari A 09/08/2015, 7:38 PM

## 2015-09-08 NOTE — Anesthesia Procedure Notes (Signed)
Procedure Name: Intubation Date/Time: 09/08/2015 8:43 AM Performed by: Tiarra Anastacio, Sheron Nightingale Pre-anesthesia Checklist: Patient identified, Emergency Drugs available, Suction available and Patient being monitored Patient Re-evaluated:Patient Re-evaluated prior to inductionOxygen Delivery Method: Circle system utilized Preoxygenation: Pre-oxygenation with 100% oxygen Intubation Type: IV induction Ventilation: Mask ventilation without difficulty and Oral airway inserted - appropriate to patient size Laryngoscope Size: Mac Grade View: Grade III Tube type: Oral Tube size: 7.0 mm Number of attempts: 2 Placement Confirmation: ETT inserted through vocal cords under direct vision,  positive ETCO2 and breath sounds checked- equal and bilateral Secured at: 20.5 cm Dental Injury: Teeth and Oropharynx as per pre-operative assessment  Difficulty Due To: Difficulty was unanticipated and Difficult Airway- due to immobile epiglottis

## 2015-09-08 NOTE — Anesthesia Postprocedure Evaluation (Signed)
Anesthesia Post Note  Patient: [name]  Procedure(s) Performed: Procedure(s) (LRB): ABDOMINAL MYOMECTOMY (N/A)  Patient location during evaluation: PACU Anesthesia Type: General Level of consciousness: awake and alert Pain management: pain level controlled Vital Signs Assessment: post-procedure vital signs reviewed and stable Respiratory status: spontaneous breathing, nonlabored ventilation, respiratory function stable and patient connected to nasal cannula oxygen Cardiovascular status: blood pressure returned to baseline and stable Postop Assessment: no signs of nausea or vomiting Anesthetic complications: no Comments: Patient was tachycardic in recovery room to 130s. HR did not respond to 1L bolus of cyrstalloid. CBC checked and Hgb was 7.7. Patient bolused with 537mL of 5% albumin and HR improved to low 100s. Patient was never hypotensive. Discussed patient with Dr. Cletis Media. Patient to receive repeat CBC at 19:00. If Hgb is lower, patient will be transfused. 2 units pRBCs are crossed.     Last Vitals:  Vitals:   09/08/15 1615 09/08/15 1645  BP: 122/81 129/84  Pulse: (!) 130 (!) 113  Resp: 16 12  Temp:      Last Pain:  Vitals:   09/08/15 1715  TempSrc:   PainSc: 3    Pain Goal: Patients Stated Pain Goal: 3 (09/08/15 1715)               Nilda Simmer

## 2015-09-09 ENCOUNTER — Encounter (HOSPITAL_COMMUNITY): Payer: Self-pay | Admitting: Obstetrics and Gynecology

## 2015-09-09 LAB — CBC
HEMATOCRIT: 22.9 % — AB (ref 36.0–46.0)
HEMOGLOBIN: 7.9 g/dL — AB (ref 12.0–15.0)
MCH: 26.8 pg (ref 26.0–34.0)
MCHC: 34.5 g/dL (ref 30.0–36.0)
MCV: 77.6 fL — ABNORMAL LOW (ref 78.0–100.0)
Platelets: 131 10*3/uL — ABNORMAL LOW (ref 150–400)
RBC: 2.95 MIL/uL — AB (ref 3.87–5.11)
RDW: 13.8 % (ref 11.5–15.5)
WBC: 8 10*3/uL (ref 4.0–10.5)

## 2015-09-09 MED ORDER — DIPHENHYDRAMINE HCL 25 MG PO CAPS
25.0000 mg | ORAL_CAPSULE | Freq: Three times a day (TID) | ORAL | Status: DC | PRN
  Administered 2015-09-09 (×2): 25 mg via ORAL
  Filled 2015-09-09 (×2): qty 1

## 2015-09-09 MED ORDER — IBUPROFEN 600 MG PO TABS
600.0000 mg | ORAL_TABLET | Freq: Four times a day (QID) | ORAL | Status: DC
  Administered 2015-09-09 – 2015-09-10 (×3): 600 mg via ORAL
  Filled 2015-09-09 (×3): qty 1

## 2015-09-09 MED ORDER — IBUPROFEN 600 MG PO TABS: 600.0000 mg | ORAL_TABLET | Freq: Four times a day (QID) | ORAL | 0 refills | Status: DC | PRN

## 2015-09-09 MED ORDER — DOCUSATE SODIUM 100 MG PO CAPS: 100.0000 mg | ORAL_CAPSULE | Freq: Two times a day (BID) | ORAL | 0 refills | Status: DC

## 2015-09-09 MED ORDER — OXYCODONE-ACETAMINOPHEN 5-325 MG PO TABS: 1.0000 | ORAL_TABLET | ORAL | 0 refills | Status: DC | PRN

## 2015-09-09 MED ORDER — FUROSEMIDE 10 MG/ML IJ SOLN
20.0000 mg | Freq: Once | INTRAMUSCULAR | Status: AC
Start: 2015-09-09 — End: 2015-09-09
  Administered 2015-09-09: 20 mg via INTRAVENOUS
  Filled 2015-09-09: qty 2

## 2015-09-09 MED ORDER — FERIVA 21/7 75-1 MG PO TABS: 1.0000 | ORAL_TABLET | Freq: Every morning | ORAL | 1 refills | Status: AC

## 2015-09-09 MED ORDER — ONDANSETRON HCL 4 MG PO TABS: 4.0000 mg | ORAL_TABLET | Freq: Three times a day (TID) | ORAL | 0 refills | Status: DC | PRN

## 2015-09-09 NOTE — Progress Notes (Signed)
Dr. Cletis Media at bedside, ok to saline lock iv.

## 2015-09-09 NOTE — Progress Notes (Signed)
Foley discontinued per order placed at 0600 this date.  Patient also request removal of device, strongly.

## 2015-09-09 NOTE — Anesthesia Postprocedure Evaluation (Signed)
Anesthesia Post Note  Patient: [name]  Procedure(s) Performed: Procedure(s) (LRB): ABDOMINAL MYOMECTOMY (N/A)  Patient location during evaluation: [facility] Anesthesia Type: General Level of consciousness: awake, awake and alert, oriented and patient cooperative Pain management: pain level controlled Vital Signs Assessment: post-procedure vital signs reviewed and stable Respiratory status: spontaneous breathing, nonlabored ventilation and respiratory function stable Cardiovascular status: stable Postop Assessment: patient able to bend at knees, no headache, no backache and no signs of nausea or vomiting Anesthetic complications: no     Last Vitals:  Vitals:   09/09/15 0348 09/09/15 0545  BP: 120/70 (!) 113/59  Pulse: (!) 110 88  Resp: 17 18  Temp: 37.1 C 37.1 C    Last Pain:  Vitals:   09/09/15 0720  TempSrc:   PainSc: 3    Pain Goal: Patients Stated Pain Goal: 3 (09/09/15 0720)               [name] L

## 2015-09-09 NOTE — Discharge Instructions (Signed)
POST-OPERATIVE INSTRUCTIONS TO PATIENT  Call [facility]  [phone])  for excessive pain, bleeding or temperature greater than or equal to 100.4 degrees (orally).    No driving for 2 weeks No lifting (more than 20 lbs) for 6 weeks No sexual activity for 6 weeks  Pain management:  Use Ibuprofen 600 mg every 6 hours for 7 days and then as needed. Use your pain medication as needed to maintain a pain level at or below 3/10 (no frowning) Use Colace 1-2 capsules per day as long as you are using pain  medication to avoid constipation.       Diet: normal  Bathing: may shower day after surgery  Wound Care: keep incision clean and dry. Remove dressing and keep incision uncovered on 09/14/15  Return to Dr. Cletis Media on as scheduled  Return to work: To be determined at post operative visit.    [id number] A MD 09/09/2015 12:10 PM

## 2015-09-09 NOTE — Progress Notes (Signed)
Narcotic script, un-executed, found and placed in chart. Camera operator Aware.

## 2015-09-09 NOTE — Progress Notes (Signed)
[name] is a65 y.o.  [id number]  Post Op Date # 1:  Abdominal Myomectomy  Subjective: Patient is Postoperative course complicated by transient decrease in urine output but for the past 3 hours has had an output of 75 cc.. Patient has Pain is controlled with current analgesics. Medications being used: prescription NSAID's including Ketorolac 30 mg and narcotic analgesics including Percocet 5/325.Marland Kitchen Has not ambulated,  Foley remains and is tolerating clear liquids.   Objective: Vital signs in last 24 hours: Temp:  [97 F (36.1 C)-98.7 F (37.1 C)] 98.7 F (37.1 C) (08/17 0545) Pulse Rate:  [88-137] 88 (08/17 0545) Resp:  [10-20] 18 (08/17 0545) BP: (113-136)/(59-86) 113/59 (08/17 0545) SpO2:  [98 %-100 %] 98 % (08/17 0545)  Intake/Output from previous day: 08/16 0701 - 08/17 0700 In: 8162.1 [P.O.:830; I.V.:6165.1] Out: 2890 [id number] Intake/Output this shift: No intake/output data recorded.  Recent Labs Lab 09/08/15 1547 09/08/15 2158 09/09/15 0637  WBC 15.6*  --  8.0  HGB 7.7* 5.9* 7.9*  HCT 23.2* 17.3* 22.9*  PLT 199  --  131*    No results for input(s): NA, K, CL, CO2, BUN, CREATININE, CALCIUM, PROT, BILITOT, ALKPHOS, ALT, AST, GLUCOSE in the last 168 hours.  Invalid input(s): LABALBU  EXAM: General: alert, no distress and anxious to have Foley removed due to discomfort. Resp: clear to auscultation bilaterally Cardio: regular rate and rhythm, S1, S2 normal, no murmur, click, rub or gallop GI: Decreased bowel sounds; dressing clean/dry/intact. Extremities: Homans sign is negative, no sign of DVT and SCD hose in place and functioning.   Assessment: s/p Procedure(s): ABDOMINAL MYOMECTOMY: stable, progressing well, anemia and S/P transfusion of 2 units of PRBC  Plan: Advance diet Encourage ambulation Consider discontinuing Foley   Routine care   LOS: 1 day    POWELL,ELMIRA, PA-C 09/09/2015 7:47 AM  Patient seen and agree with above Voided 350 cc  and 200 cc in the last 2 hours after Lasix IV Pain is well managed with oral pain meds Denies shortness of breath, palpitations, tinnitus. Has gotten out of bed with assistance twice.  Encouraged ambulation and hydration: will D/C iv fluids and saline lock IV Start Ibuprofen QID Anticipate D/C home in am

## 2015-09-10 LAB — TYPE AND SCREEN
ABO/RH(D): O POS
ANTIBODY SCREEN: NEGATIVE
UNIT DIVISION: 0
Unit division: 0

## 2015-09-10 MED ORDER — DIPHENHYDRAMINE HCL 25 MG PO CAPS: 25.0000 mg | ORAL_CAPSULE | Freq: Three times a day (TID) | ORAL | 0 refills | Status: DC | PRN

## 2015-09-10 NOTE — Progress Notes (Signed)
Pt discharged with instructions. Verbalizes and understanding. No concerns noted at this time. Toya Smothers, RN

## 2015-09-17 ENCOUNTER — Encounter (HOSPITAL_COMMUNITY): Payer: Self-pay | Admitting: *Deleted

## 2015-09-17 ENCOUNTER — Inpatient Hospital Stay (HOSPITAL_COMMUNITY)
Admission: AD | Admit: 2015-09-17 | Discharge: 2015-09-17 | Disposition: A | Discharge status: Home / Self Care | Payer: 59 | Source: Ambulatory Visit | Attending: Obstetrics and Gynecology | Admitting: Obstetrics and Gynecology

## 2015-09-17 DIAGNOSIS — Z9889 Other specified postprocedural states: Secondary | ICD-10-CM | POA: Insufficient documentation

## 2015-09-17 DIAGNOSIS — Z79899 Other long term (current) drug therapy: Secondary | ICD-10-CM | POA: Diagnosis not present

## 2015-09-17 DIAGNOSIS — T8131XA Disruption of external operation (surgical) wound, not elsewhere classified, initial encounter: Secondary | ICD-10-CM | POA: Diagnosis present

## 2015-09-17 DIAGNOSIS — Z888 Allergy status to other drugs, medicaments and biological substances status: Secondary | ICD-10-CM | POA: Diagnosis not present

## 2015-09-17 NOTE — Discharge Instructions (Signed)
Wound Dehiscence Wound dehiscence is when a surgical cut (incision) breaks open and does not heal properly after surgery. It usually happens 7-10 days after surgery. This can be a serious condition. It is important to identify and treat this condition early.  CAUSES  Some common causes of wound dehiscence include:  Stretching of the wound area. This may be caused by lifting, vomiting, violent coughing, or straining during bowel movements.  Wound infection.  Early stitch (suture) removal. RISK FACTORS Various things can increase your risk of developing wound dehiscence, including:  Obesity.  Lung disease.  Smoking.  Poor nutrition.  Contamination during surgery. SIGNS AND SYMPTOMS  Bleeding from the wound.  Pain.  Fever.  Wound starts breaking open. DIAGNOSIS  Your health care provider may diagnose wound dehiscence by monitoring the incision and noting any changes in the wound. These changes can include an increase in drainage or pain. The health care provider may also ask you if you have noticed any stretching or tearing of the wound.  Wound cultures may be taken to determine if there is an infection.  Imaging studies, such as an MRI scan or CT scan, may be done to determine if there is a collection of pus or fluid in the wound area. TREATMENT Treatment may include:  Wound care.  Surgical repair.  Antibiotic medicine to treat or prevent infection.  Medicines to reduce pain and swelling. HOME CARE INSTRUCTIONS   Only take over-the-counter or prescription medicines for pain, discomfort, or fever as directed by your health care provider. Taking pain medicine 30 minutes before changing a bandage (dressing) can help relieve pain.  Take your antibiotics as directed. Finish them even if you start to feel better.  Gently wash the area with mild soap and water 2 times a day, or as directed. Rinse off the soap. Pat the area dry with a clean towel. Do not rub the wound.  This may cause bleeding.  Follow your health care provider's instructions for how often you need to change the dressing and packing inside. Wash your hands well before and after changing your dressing. Apply a dressing to the wound as directed.  Take showers. Do not soak the wound, bathe, swim, or use a hot tub until directed by your health care provider.  Avoid exercises that make you sweat heavily.  Use anti-itch medicine as directed by your health care provider. The wound may itch when it is healing. Do not pick or scratch at the wound.  Do not lift more than 10 pounds (4.5 kg) until the wound is healed, or as directed by your health care provider.  Keep all follow-up appointments as directed. SEEK MEDICAL CARE IF:  You have excessive bleeding from your surgical wound.  Your wound does not seem to be healing properly.  You have a fever. SEEK IMMEDIATE MEDICAL CARE IF:   You have increased swelling or redness around the wound.  You have increasing pain in the wound.  You have an increasing amount of pus coming from the wound.  Your wound breaks open farther. MAKE SURE YOU:  Understand these instructions.Wound Packing Wound packing involves placing a moistened packing material into your wound and then covering it with an outer bandage (dressing). This helps promote proper healing of deep tissue and tissue under the skin. It also helps prevent bleeding, infection, and further injury.  Wounds are packed until deep tissue has had time to heal. The time it takes for this to occur is different for everyone.  Your health care provider will show you how to pack and dress your wound. Using gloves and a sterile technique is important in order to avoid spreading germs into your wound. RISKS AND COMPLICATIONS Infection. Delayed or abnormal healing. HOW TO PACK YOUR WOUND Follow your health care provider's instructions on how often you need to change dressings and pack your wound. You will  likely be asked to change dressings 1-2 times a day. Supplies Needed Gloves. Wetting solution. Clean bowl. Packing material (gauze or gauze sponges). Clean towels. Outer dressing. Tape. Cotton balls or cotton-tipped swabs. Small plastic bag. Preparing Your New Packing Material Make sure your work surface or countertop is clean and disinfected. Wash your hands well with soap and water. Put a clean towel on the counter. Put a clean bowl on the towel. Be sure to only touch the outside of the bowl when handling it. Pour wetting solution into the bowl. Cut your packing material (gauze or sponges) to the right size for your wound. Drop it into the bowl. Cut four tape strips that you will use to seal the outer dressing. Put cotton balls or cotton-tipped swabs on the clean towel. Removing the Old Packing and Dressing Gently remove the old dressing and packing material. Put the removed items into the plastic bag to throw away later. Wash your hands well with soap and water again. Applying New Packing Material and Dressing Put on your gloves. Squeeze the packing material in the bowl to release excess liquid. The packing material should be moist, but not dripping wet. Gently place the packing material into the wound. Use a cotton ball or cotton-tipped swab to guide it into place, filling all of the space. Dry your fingertips on the towel. Open up your outer dressing supplies and put them on a dry part of the towel. Keep them from getting wet. Place the dressing over the packed wound. Tape the four outer edges of the dressing in place. Remove your gloves. Wash your hands again with soap and water. General Tips Follow your health care provider's instructions on how tightly to pack the wound. At first, the wound will be packed tightly to help stop bleeding. As the wound begins to heal inside, you will use less packing material and pack the wound loosely to allow tissue to heal slowly from the  inside out. Keep the dressing clean and dry. Follow any other instructions given by your health care provider on how to aid healing. This may include applying warm or cold compresses, elevating the affected area, or wearing a compression dressing. Ask your health care provider about sun exposure and sunscreen when the dressings are no longer needed. Keep all follow-up visits with your health care provider. This is important. SEEK MEDICAL CARE IF: You have drainage, redness, swelling, or pain at your wound site. You notice a bad smell coming from the wound site. Your pain is not controlled with pain medicine. Tissue inside your wound changes color from pink to white, yellow, or black. Your wound changes in size or depth. You have a fever. You have shaking chills. You are having trouble packing your wound.   This information is not intended to replace advice given to you by your health care provider. Make sure you discuss any questions you have with your health care provider.   Document Released: 08/06/2013 Document Reviewed: 08/06/2013 Elsevier Interactive Patient Education [employer].    Will watch your condition.  Will get help right away if you are not  doing well or get worse.   This information is not intended to replace advice given to you by your health care provider. Make sure you discuss any questions you have with your health care provider.   Document Released: 04/01/2003 Document Revised: 01/14/2013 Document Reviewed: 09/16/2012 Elsevier Interactive Patient Education [employer].

## 2015-09-17 NOTE — MAU Provider Note (Signed)
Chief Complaint:  Post-op Problem   First Provider Initiated Contact with Patient 09/17/15 1203     HPI: [name] is a 45 y.o. G1P0010 who presents to maternity admissions reporting opening at her umbilicus.  Had an open myomectomy on 09/08/15 by Dr Cletis Media.  Now has an opening and has some discharge from it.   Does report tenderness there. She reports no vaginal bleeding, vaginal itching/burning, urinary symptoms, h/a, dizziness, n/v, or fever/chills.    Wound Check  She was originally treated 5 to 10 days ago. Her temperature was unmeasured prior to arrival. There has been clear discharge from the wound. There is no redness present. There is no swelling present. The pain has not changed. She has no difficulty moving the affected extremity or digit.   RN Note: Had surgery on the 16th.  Has a big gaping hole at [facility] office.  MD was not in and they could not get her in so came here.  Pt has large vertical incision, appears to be healing well.  Has approx 1cm open area rt lower side of umbilicus.  Pt reports 'leaking' from it.  Has not had to take pain meds  Past Medical History: Past Medical History:  Diagnosis Date  . Anemia   . Chest pain   . Costal chondritis   . Costochondritis    Acute  . Costochondritis   . Hx of myomectomy 09-30-2008   sr did the surgery  . Oth tear of unsp mensc, current injury, unsp knee, sequela   . Swine flu 2009  . Uterine fibroid     Past obstetric history: OB History  Gravida Para Term Preterm AB Living  1 0     1 0  SAB TAB Ectopic Multiple Live Births  1            # Outcome Date GA Lbr Len/2nd Weight Sex Delivery Anes PTL Lv  1 SAB               Past Surgical History: Past Surgical History:  Procedure Laterality Date  . CERVICAL BIOPSY    . MYOMECTOMY     sr did surgery  . MYOMECTOMY N/A 09/08/2015   Procedure: ABDOMINAL MYOMECTOMY;  Surgeon: Delsa Bern, MD;  Location: [facility];  Service: Gynecology;  Laterality: N/A;  .  UTERINE ARTERY EMBOLIZATION      Family History: Family History  Problem Relation Age of Onset  . Hypertension Mother   . Hypertension Brother   . Diabetes Maternal Grandmother   . Diabetes Maternal Aunt   . Diabetes Maternal Aunt   . Diabetes Maternal Aunt     Social History: Social History  Substance Use Topics  . Smoking status: Never Smoker  . Smokeless tobacco: Never Used  . Alcohol use No    Allergies:  Allergies  Allergen Reactions  . Pollen Extract Itching    Pollen make pt eyes itch.  lm    Meds:  Prescriptions Prior to Admission  Medication Sig Dispense Refill Last Dose  . diphenhydrAMINE (BENADRYL) 25 mg capsule Take 1 capsule (25 mg total) by mouth every 8 (eight) hours as needed for itching. 30 capsule 0 Past Week at Unknown time  . ibuprofen (ADVIL,MOTRIN) 600 MG tablet Take 1 tablet (600 mg total) by mouth every 6 (six) hours as needed (mild pain). 30 tablet 0 Past Week at Unknown time  . levocetirizine (XYZAL) 5 MG tablet Take 5 mg by mouth daily as needed for  allergies.   Past Week at Unknown time  . ondansetron (ZOFRAN) 4 MG tablet Take 1 tablet (4 mg total) by mouth every 8 (eight) hours as needed for nausea or vomiting. 20 tablet 0 Past Week at Unknown time  . oxyCODONE-acetaminophen (PERCOCET/ROXICET) 5-325 MG tablet Take 1-2 tablets by mouth every 3 (three) hours as needed for severe pain. 30 tablet 0 Past Week at Unknown time  . RECLIPSEN 0.15-30 MG-MCG tablet Take 1 tablet by mouth daily.  3 09/16/2015 at Unknown time  . docusate sodium (COLACE) 100 MG capsule Take 1 capsule (100 mg total) by mouth 2 (two) times daily. (Patient not taking: Reported on 09/17/2015) 10 capsule 0 Not Taking at Unknown time  . FeAsp-B12-FA-C-DSS-SuccAc-Zn (FERIVA 21/7) 75-1 MG TABS Take 1 tablet by mouth every morning. (Patient not taking: Reported on 09/17/2015) 28 tablet 1 Not Taking at Unknown time    I have reviewed patient's Past Medical Hx, Surgical Hx, Family Hx,  Social Hx, medications and allergies.  ROS:  Review of Systems  Constitutional: Negative for chills and fever.  Respiratory: Negative for shortness of breath.   Gastrointestinal: Positive for abdominal pain. Negative for constipation, diarrhea, nausea and vomiting.  Genitourinary: Negative for dysuria and vaginal bleeding.  Musculoskeletal: Negative for back pain.   Other systems negative    Physical Exam  Patient Vitals for the past 24 hrs:  BP Temp Temp src Pulse Resp  09/17/15 1103 137/86 98.2 F (36.8 C) Oral 93 18   Constitutional: Well-developed, well-nourished female in no acute distress.  Cardiovascular: normal rate and rhythm Respiratory: normal effort, no distress. GI: Abd soft, non-tender.  Nondistended.  No rebound, No guarding.   MS: Extremities nontender, no edema, normal ROM Neurologic: Alert and oriented x 4.   Grossly nonfocal. GU: Neg CVAT. Skin:  Warm and Dry          Labs: No results found for this or any previous visit (from the past 24 hour(s)). --/--/O POS (08/04 1555)  Imaging:  No results found.  MAU Course/MDM:  Consult Dr Mancel Bale who came and examined the wound.  Dr Mancel Bale packed it with half inch plain gauze soaked in saline and covered wound with 2x2 gauze square and paper tape.  Patient was instructed in how to do this at home.   Treatments in MAU included wound packing.   Pt stable at time of discharge.  Assessment: Wound dehiscence, 9 days postop  Plan: Discharge home Recommend change dressing daily at home Left message at office for pt to be seen by Dr Cletis Media next week   Encouraged to return here or to other Urgent Care/ED if she develops worsening of symptoms, increase in pain, fever, or other concerning symptoms.   Hansel Feinstein CNM, MSN Certified Nurse-Midwife 09/17/2015 12:24 PM

## 2015-09-17 NOTE — MAU Note (Signed)
Had surgery on the 16th.  Has a big gaping hole at [facility] office.  MD was not in and they could not get her in so came here.  Pt has large vertical incision, appears to be healing well.  Has approx 1cm open area rt lower side of umbilicus.  Pt reports 'leaking' from it.  Has not had to take pain meds

## 2015-09-22 NOTE — Discharge Summary (Signed)
Physician Discharge Summary  Patient ID: OTHA CARLYLE MRN: [id number] DOB/AGE: 46-Dec-1972 44 y.o.  Admit date: 09/08/2015 Discharge date: 09/22/2015   Discharge Diagnoses: Symptomatic Uterine Fibroids Active Problems:   Fibroids   Operation: Abdominal Myomectomy   Discharged Condition: Good  Hospital Course: On the date of admission the patient underwent the aforementioned procedure, removing #39 fibroids with an aggregate weight of 400 grams,  and tolerated the procedure  well.  Post operative course was unremarkable with the patient resuming bowel and bladder function by post operative day # 2 and was therefore deemed ready for discharge home. Discharge hemoglobin was 7.9.  Disposition: 01-Home or Self Care  Discharge Medications:    Medication List    STOP taking these medications   cephALEXin 500 MG capsule Commonly known as:  KEFLEX   diclofenac 75 MG EC tablet Commonly known as:  VOLTAREN   HYDROcodone-acetaminophen 5-325 MG tablet Commonly known as:  NORCO   naproxen sodium 220 MG tablet Commonly known as:  ANAPROX     TAKE these medications   diphenhydrAMINE 25 mg capsule Commonly known as:  BENADRYL Take 1 capsule (25 mg total) by mouth every 8 (eight) hours as needed for itching.   FERIVA 21/7 75-1 MG Tabs Take 1 tablet by mouth every morning.   ibuprofen 600 MG tablet Commonly known as:  ADVIL,MOTRIN Take 1 tablet (600 mg total) by mouth every 6 (six) hours as needed (mild pain). What changed:  medication strength  how much to take  when to take this  reasons to take this   levocetirizine 5 MG tablet Commonly known as:  XYZAL Take 5 mg by mouth daily as needed for allergies.   ondansetron 4 MG tablet Commonly known as:  ZOFRAN Take 1 tablet (4 mg total) by mouth every 8 (eight) hours as needed for nausea or vomiting.   oxyCODONE-acetaminophen 5-325 MG tablet Commonly known as:  PERCOCET/ROXICET Take 1-2 tablets by mouth every 3 (three)  hours as needed for severe pain.   RECLIPSEN 0.15-30 MG-MCG tablet Generic drug:  desogestrel-ethinyl estradiol Take 1 tablet by mouth daily.          Follow-up: Dr. Katharine Look A. Rivard on  October 19, 2015 at 9:30 a.m.     SignedEarnstine Regal, PA-C 09/22/2015, 7:17 AM

## 2016-05-15 ENCOUNTER — Other Ambulatory Visit: Payer: Self-pay | Admitting: General Practice

## 2016-05-15 DIAGNOSIS — S8992XS Unspecified injury of left lower leg, sequela: Secondary | ICD-10-CM

## 2016-05-19 ENCOUNTER — Ambulatory Visit: Payer: 59 | Admitting: Family Medicine

## 2016-05-21 ENCOUNTER — Ambulatory Visit
Admission: RE | Admit: 2016-05-21 | Discharge: 2016-05-21 | Disposition: A | Discharge status: Home / Self Care | Payer: 59 | Source: Ambulatory Visit | Attending: General Practice | Admitting: General Practice

## 2016-05-21 DIAGNOSIS — S8992XS Unspecified injury of left lower leg, sequela: Secondary | ICD-10-CM

## 2016-05-25 ENCOUNTER — Ambulatory Visit (INDEPENDENT_AMBULATORY_CARE_PROVIDER_SITE_OTHER): Payer: 59 | Admitting: Family Medicine

## 2016-05-25 ENCOUNTER — Encounter: Payer: Self-pay | Admitting: Family Medicine

## 2016-05-25 DIAGNOSIS — G8929 Other chronic pain: Secondary | ICD-10-CM | POA: Diagnosis not present

## 2016-05-25 DIAGNOSIS — M25562 Pain in left knee: Secondary | ICD-10-CM | POA: Diagnosis not present

## 2016-05-25 NOTE — Patient Instructions (Addendum)
Your pain is due to patellofemoral syndrome and arthritis. These are the different medications you can take for this: Tylenol 500mg  1-2 tabs three times a day for pain. Capsaicin, aspercreme, or biofreeze topically up to four times a day may also help with pain. Some supplements that may help for arthritis: Boswellia extract, curcumin, pycnogenol Aleve 1-2 tabs twice a day with food. Cortisone injections are an option. If cortisone injections do not help, there are different types of shots (gel shots) that may help but they take longer to take effect. It's important that you continue to stay active. Straight leg raises, knee extensions 3 sets of 10 once a day (add ankle weight if these become too easy). Start physical therapy and do home exercises on days you don't go to therapy. Shoe inserts with good arch support may be helpful (spencos, dr. Zoe Lan active series, our sports insoles with scaphoid pads). Heat or ice 15 minutes at a time 3-4 times a day as needed to help with pain. Water aerobics and cycling with low resistance are the best two types of exercise for arthritis. Follow up with me in 6 weeks for reevaluation

## 2016-05-26 NOTE — Progress Notes (Signed)
PCP: Vena Austria, MD  Subjective:   HPI: Patient is a 46 y.o. female here for left knee pain.  Patient reports back in February 2017 she fell in a parking lot and injured her left knee. She continued to have pain despite physical therapy, prednisone, NSAIDs, KT tape. She then had surgery in December - microfracture of defect in medial femoral condyle. She feels symptoms have worsened since then. Difficulty walking inclines, with prolonged standing. States anterior knee 'feels funny' Pain level is 3/10, can be sharp. No skin changes. She does report falling off bed in recovery area on day of knee surgery also. Some numbness into left foot.  Past Medical History:  Diagnosis Date  . Anemia   . Chest pain   . Costal chondritis   . Costochondritis    Acute  . Costochondritis   . Hx of myomectomy 09-30-2008   sr did the surgery  . Oth tear of unsp mensc, current injury, unsp knee, sequela   . Swine flu 2009  . Uterine fibroid     Current Outpatient Prescriptions on File Prior to Visit  Medication Sig Dispense Refill  . FeAsp-B12-FA-C-DSS-SuccAc-Zn (FERIVA 21/7) 75-1 MG TABS Take 1 tablet by mouth every morning. (Patient not taking: Reported on 09/17/2015) 28 tablet 1  . levocetirizine (XYZAL) 5 MG tablet Take 5 mg by mouth daily as needed for allergies.    Marland Kitchen oxyCODONE-acetaminophen (PERCOCET/ROXICET) 5-325 MG tablet Take 1-2 tablets by mouth every 3 (three) hours as needed for severe pain. 30 tablet 0   No current facility-administered medications on file prior to visit.     Past Surgical History:  Procedure Laterality Date  . CERVICAL BIOPSY    . MYOMECTOMY     sr did surgery  . MYOMECTOMY N/A 09/08/2015   Procedure: ABDOMINAL MYOMECTOMY;  Surgeon: Delsa Bern, MD;  Location: [facility];  Service: Gynecology;  Laterality: N/A;  . UTERINE ARTERY EMBOLIZATION      Allergies  Allergen Reactions  . Pollen Extract Itching    Pollen make pt eyes itch.  lm     Social History   Social History  . Marital status: Divorced    Spouse name: N/A  . Number of children: 0  . Years of education: N/A   Occupational History  . [employer] History Main Topics  . Smoking status: Never Smoker  . Smokeless tobacco: Never Used  . Alcohol use No  . Drug use: No  . Sexual activity: No   Other Topics Concern  . Not on file   Social History Narrative  . No narrative on file    Family History  Problem Relation Age of Onset  . Hypertension Mother   . Hypertension Brother   . Diabetes Maternal Grandmother   . Diabetes Maternal Aunt   . Diabetes Maternal Aunt   . Diabetes Maternal Aunt     BP 136/89   Pulse 80   Ht 5\' 3"  (1.6 m)   Wt 190 lb (86.2 kg)   BMI 33.66 kg/m   Review of Systems: See HPI above.     Objective:  Physical Exam:  Gen: NAD, comfortable in exam room  Left knee: Mod pronation. No gross deformity, ecchymoses, effusion.  VMO atrophy TTP post patellar facets and medial > lateral joint lines. FROM. Negative ant/post drawers. Negative valgus/varus testing. Negative lachmanns. Hip abduction 4/5. Negative mcmurrays, apleys, patellar apprehension. NV intact distally.  Right knee: FROM without pain currently.  Assessment & Plan:  1. Left knee pain - history and exam consistent with patellofemoral syndrome and arthritis.  She had microfracture surgery of medial femoral condyle in December.  Repeat MRI showed cartilage loss medially with underlying marrow edema; possible she has a small tear of lateral meniscus.  Will start with physical therapy.  We discussed tylenol, topical medications, aleve, some supplements that may help.  Consider cortisone, supartz series.  Shown home exercises to do daily as well.  Arch supports, avoid flat shoes and barefoot walking.  Heat/ice.  F/u in 6 weeks.

## 2016-05-28 DIAGNOSIS — M25562 Pain in left knee: Secondary | ICD-10-CM | POA: Insufficient documentation

## 2016-05-28 NOTE — Assessment & Plan Note (Signed)
history and exam consistent with patellofemoral syndrome and arthritis.  She had microfracture surgery of medial femoral condyle in December.  Repeat MRI showed cartilage loss medially with underlying marrow edema; possible she has a small tear of lateral meniscus.  Will start with physical therapy.  We discussed tylenol, topical medications, aleve, some supplements that may help.  Consider cortisone, supartz series.  Shown home exercises to do daily as well.  Arch supports, avoid flat shoes and barefoot walking.  Heat/ice.  F/u in 6 weeks.

## 2016-05-29 ENCOUNTER — Telehealth: Payer: Self-pay | Admitting: Family Medicine

## 2016-05-29 ENCOUNTER — Ambulatory Visit: Payer: 59 | Attending: Family Medicine | Admitting: Physical Therapy

## 2016-05-29 DIAGNOSIS — R262 Difficulty in walking, not elsewhere classified: Secondary | ICD-10-CM | POA: Insufficient documentation

## 2016-05-29 DIAGNOSIS — M25562 Pain in left knee: Secondary | ICD-10-CM | POA: Insufficient documentation

## 2016-05-29 NOTE — Telephone Encounter (Signed)
Patient calling requesting to be referred to a different physical therapist

## 2016-05-29 NOTE — Telephone Encounter (Signed)
Nevin Bloodgood - Can you check to see what the issue is?  Thanks!

## 2016-05-29 NOTE — Therapy (Signed)
[facility] [address]  [place], [place], [address] Phone: [phone]   Fax:  [phone]  Physical Therapy Evaluation  Patient Details  Name: [name] MRN: [id number] Date of Birth: Oct 02, 1970 Referring Provider: Dr. Karlton Lemon  Encounter Date: 05/29/2016      PT End of Session - 05/29/16 0958    Visit Number 1   Number of Visits 12   Date for PT Re-Evaluation 07/10/16   PT Start Time 0933   PT Stop Time 1011   PT Time Calculation (min) 38 min   Activity Tolerance Patient tolerated treatment well   Behavior During Therapy [facility] for tasks assessed/performed      Past Medical History:  Diagnosis Date  . Anemia   . Chest pain   . Costal chondritis   . Costochondritis    Acute  . Costochondritis   . Hx of myomectomy 09-30-2008   sr did the surgery  . Oth tear of unsp mensc, current injury, unsp knee, sequela   . Swine flu 2009  . Uterine fibroid     Past Surgical History:  Procedure Laterality Date  . CERVICAL BIOPSY    . MYOMECTOMY     sr did surgery  . MYOMECTOMY N/A 09/08/2015   Procedure: ABDOMINAL MYOMECTOMY;  Surgeon: Delsa Bern, MD;  Location: [facility];  Service: Gynecology;  Laterality: N/A;  . UTERINE ARTERY EMBOLIZATION      There were no vitals filed for this visit.       Subjective Assessment - 05/29/16 0935    Subjective Patient reporting a history of knee pain - fell in Feb 2017; had an artroscopy - fell off the table after surgery - has had pain since. Has most difficulty with stairs, going down incline, and prolonged standing - all of which were excerbated follwoing fall after surgery. Reports foot numbness since surgery. Average daily pain of 1-3/10.   Limitations Standing;Walking   How long can you stand comfortably? 10 minutes   How long can you walk comfortably? 10-20 minutes   Patient Stated Goals return to exercise, improve function   Currently in Pain? No/denies    Pain Score 0-No pain  only reporting L foot numbeness currently   Pain Location Knee   Pain Orientation Left            [facility] PT Assessment - 05/29/16 0938      Assessment   Medical Diagnosis L knee pain   Referring Provider Dr. Karlton Lemon   Next MD Visit --  June 2018   Prior Therapy yes - same issue     Precautions   Precautions None     Restrictions   Weight Bearing Restrictions No     Balance Screen   Has the patient fallen in the past 6 months No   Has the patient had a decrease in activity level because of a fear of falling?  No   Is the patient reluctant to leave their home because of a fear of falling?  No     Home Ecologist residence   Living Arrangements Alone     Prior Function   Level of [place] Retired   Leisure walk dog, shopping     Cognition   Overall Cognitive Status Within Functional Limits for tasks assessed     Observation/Other Assessments   Focus on Therapeutic Outcomes (FOTO)  Knee: 43 (57% limited, predicted  42% limited)     Sensation   Light Touch Appears Intact     Coordination   Gross Motor Movements are Fluid and Coordinated Yes     ROM / Strength   AROM / PROM / Strength AROM;Strength     AROM   AROM Assessment Site Knee   Right/Left Knee Right;Left   Right Knee Extension -2   Right Knee Flexion 140   Left Knee Extension -2   Left Knee Flexion 134     Strength   Strength Assessment Site Hip;Knee   Right/Left Hip Right;Left   Right Hip Flexion 4/5   Right Hip Extension 4-/5   Right Hip ABduction 4-/5   Left Hip Flexion 4-/5   Left Hip Extension 3+/5   Left Hip ABduction 4-/5   Right/Left Knee Right;Left   Right Knee Flexion 5/5   Right Knee Extension 5/5   Left Knee Flexion 4+/5   Left Knee Extension 4/5     Flexibility   Soft Tissue Assessment /Muscle Length yes   Hamstrings B tightness     Palpation   Patella mobility reduced mobility of L  compared to R     Special Tests    Special Tests Knee Special Tests;Meniscus Tests   Knee Special tests  Patellofemoral Grind Test (Clarke's Sign)   Meniscus Tests McMurray Test     Patellofemoral Grind test (Clark's Sign)   Findings Postive   Side  Left     McMurray Test   Findings Negative   Side Left                   [facility] Adult PT Treatment/Exercise - 05/29/16 0001      Exercises   Exercises Knee/Hip     Knee/Hip Exercises: Stretches   Passive Hamstring Stretch Right;Left;3 reps;30 seconds   Passive Hamstring Stretch Limitations supine with strap     Knee/Hip Exercises: Seated   Long Arc Quad Strengthening;Left;15 reps   Long Arc Quad Limitations with adduction ball squeeze     Knee/Hip Exercises: Supine   Bridges Both;15 reps   Straight Leg Raises Left;15 reps     Knee/Hip Exercises: Sidelying   Hip ABduction Strengthening;Right;Left;15 reps                PT Education - 05/29/16 0956    Education provided Yes   Education Details exam findings, POC, HEP   Person(s) Educated Patient   Methods Explanation;Demonstration;Handout   Comprehension Verbalized understanding;Returned demonstration             PT Long Term Goals - 05/29/16 1110      PT LONG TERM GOAL #1   Title patient to be independent with HEP (07/10/16)   Status New     PT LONG TERM GOAL #2   Title Patient to report ability to return to long walks/jogs for >/= 1 mile without knee pain limiting her (07/10/16)   Status New     PT LONG TERM GOAL #3   Title Patient to improve L hip/knee strength to equal to that of R LE without pain (07/10/16)   Status New               Plan - 05/29/16 1032    Clinical Impression Statement Patient is a 46 y/o female presenting to [facility] today for initial evaluation of L knee pain. Patient with reports of knee pain since Feb 2017 when falling in parking lot at work - had scope of L knee  in December of 2017, of which she report falling  out of bed after surgery and has had persistent knee pain since that time. Patient today with near symmetrical AROM, slight decrease in L hip/knee strength, pain with joint line palpation, as well as pain with Grind test. Patient given initial HEP for quad strengthenign with good carryover and ability to perform all tasks without pain. Following eval, PT clarifying with patient that she was taking ibuprofen. Patient was sitting in lobby, PT stooped to patients level and shown a list of her current medications, PT then asking in a low voice if she was taking the ibuprofen or not with the patient responding she did not wish to discuss medications. Patient then given handout of current medications and asked to circle what she was currently taking. Patient then speaking to receptionist, requesting to cancel all appointments as she did not wish to return due to this incident.  Patient's chart to remain open at this time, and is welcome to return to PT for follow-up or any other needs.    Rehab Potential Good   PT Frequency 2x / week   PT Duration 6 weeks   PT Treatment/Interventions ADLs/Self Care Home Management;Cryotherapy;Electrical Stimulation;Iontophoresis 4mg /ml Dexamethasone;Moist Heat;Ultrasound;Neuromuscular re-education;Balance training;Therapeutic exercise;Therapeutic activities;Functional mobility training;Stair training;Gait training;Patient/family education;Manual techniques;Passive range of motion;Vasopneumatic Device;Taping;Dry needling   Consulted and Agree with Plan of Care Patient      Patient will benefit from skilled therapeutic intervention in order to improve the following deficits and impairments:  Decreased activity tolerance, Decreased strength, Difficulty walking, Pain  Visit Diagnosis: Left knee pain, unspecified chronicity - Plan: PT plan of care cert/re-cert  Difficulty in walking, not elsewhere classified - Plan: PT plan of care cert/re-cert     Problem List Patient  Active Problem List   Diagnosis Date Noted  . Left knee pain 05/28/2016  . Fibroids 09/08/2015  . 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 Seasonal allergies 06/10/2012  . Nocturia 12/01/2011  . Uterine fibroid   . Swine flu   . Costochondritis 05/03/2011  . Hx of myomectomy 09/30/2008      Lanney Gins, PT, DPT 05/29/16 4:35 PM   [facility] [address]  [place], [place], [address] Phone: [phone]   Fax:  [phone]  Name: [name] MRN: [id number] Date of Birth: January 06, 1971

## 2016-05-29 NOTE — Patient Instructions (Addendum)
Hamstring Step 2   Left foot relaxed, knee straight, other leg bent, foot flat. Raise straight leg further upward to maximal range. Hold _30__ seconds. Relax leg completely down. Repeat __2_ times.   Straight Leg Raise   Tighten stomach and slowly raise locked right leg __~8__ inches from floor. Repeat _10-15___ times per set. Do __2__ sets per session.    Bridge   Lie back, legs bent. Inhale, pressing hips up. Keeping ribs in, lengthen lower back. Exhale, rolling down along spine from top. Repeat _10-15___ times. Do __2__ sessions per day.   ABDUCTION: Side-Lying (Active)    Lie on left side, top leg straight. Raise top leg as far as possible. Use _0__ lbs. Complete __2_ sets of _15__ repetitions.    Long [employer]    Straighten operated leg and try to hold it __5__ seconds. Use _0___ lbs on ankle. Repeat __2__ times.  **squeeze ball or beach towel**

## 2016-05-30 NOTE — Telephone Encounter (Signed)
Spoke to patient and faxed physical therapy order to different physical therapist.

## 2016-05-31 ENCOUNTER — Ambulatory Visit: Payer: 59

## 2016-07-06 ENCOUNTER — Ambulatory Visit: Payer: 59 | Admitting: Family Medicine

## 2016-07-07 ENCOUNTER — Encounter: Payer: Self-pay | Admitting: Family Medicine

## 2016-07-07 ENCOUNTER — Ambulatory Visit (INDEPENDENT_AMBULATORY_CARE_PROVIDER_SITE_OTHER): Payer: 59 | Admitting: Family Medicine

## 2016-07-07 DIAGNOSIS — M25562 Pain in left knee: Secondary | ICD-10-CM

## 2016-07-07 NOTE — Patient Instructions (Signed)
Your pain is due to patellofemoral syndrome and arthritis. Your exam is improved as well as your strength. Let us know the name of the therapy place you would like to go to in [place] and we can send a referral there. These are the different medications you can take for this: Tylenol 500mg  1-2 tabs three times a day for pain. Capsaicin, aspercreme, or biofreeze topically up to four times a day may also help with pain. Some supplements that may help for arthritis: Boswellia extract, curcumin, pycnogenol Aleve 1-2 tabs twice a day with food. Cortisone injections are an option. If cortisone injections do not help, there are different types of shots (gel shots) that may help but they take longer to take effect. It's important that you continue to stay active. Continue with the home exercises in the meantime. Shoe inserts with good arch support may be helpful (spencos, dr. Zoe Lan active series, our sports insoles with scaphoid pads). Heat or ice 15 minutes at a time 3-4 times a day as needed to help with pain. Water aerobics and cycling with low resistance are the best two types of exercise for arthritis. Follow up with me in 6 weeks for reevaluation

## 2016-07-10 NOTE — Assessment & Plan Note (Signed)
history and exam consistent with patellofemoral syndrome and arthritis.  Mild clinical improvement following 4 visits of PT.  Strength improved with hip abduction.  She had microfracture surgery of medial femoral condyle in December.  Repeat MRI showed cartilage loss medially with underlying marrow edema; possible she has a small tear of lateral meniscus.  Continue with physical therapy, home exercises.  Again reviewed tylenol, topical medications, aleve, some supplements that may help.  Consider cortisone, supartz series.  Arch supports, avoid flat shoes and barefoot walking.  Heat/ice.  F/u in 6 weeks.

## 2016-07-10 NOTE — Addendum Note (Signed)
Addended by: Sherrie George F on: 07/10/2016 03:28 PM   Modules accepted: Orders

## 2016-07-10 NOTE — Progress Notes (Signed)
PCP: Maury Dus, MD  Subjective:   HPI: Patient is a 46 y.o. female here for left knee pain.  5/3: Patient reports back in February 2017 she fell in a parking lot and injured her left knee. She continued to have pain despite physical therapy, prednisone, NSAIDs, KT tape. She then had surgery in December - microfracture of defect in medial femoral condyle. She feels symptoms have worsened since then. Difficulty walking inclines, with prolonged standing. States anterior knee 'feels funny' Pain level is 3/10, can be sharp. No skin changes. She does report falling off bed in recovery area on day of knee surgery also. Some numbness into left foot.  6/15: Patient reports she is doing a little better compared to last visit. Pain level 2/10. Has only been to 4 visits of PT so far but feels it is helping - would like to try a more holistic PT place though. Doing home exercises. No skin changes, numbness.  Past Medical History:  Diagnosis Date  . Anemia   . Chest pain   . Costal chondritis   . Costochondritis    Acute  . Costochondritis   . Hx of myomectomy 09-30-2008   sr did the surgery  . Oth tear of unsp mensc, current injury, unsp knee, sequela   . Swine flu 2009  . Uterine fibroid     Current Outpatient Prescriptions on File Prior to Visit  Medication Sig Dispense Refill  . AZURETTE 0.15-0.02/0.01 MG (21/5) tablet TK 1 T PO QD  4  . desogestrel-ethinyl estradiol (APRI,EMOQUETTE,SOLIA) 0.15-30 MG-MCG tablet Take 1 tablet by mouth daily.    . FeAsp-B12-FA-C-DSS-SuccAc-Zn (FERIVA 21/7) 75-1 MG TABS Take 1 tablet by mouth every morning. (Patient not taking: Reported on 09/17/2015) 28 tablet 1  . ibuprofen (ADVIL,MOTRIN) 600 MG tablet Take 600 mg by mouth every 6 (six) hours as needed.    Marland Kitchen levocetirizine (XYZAL) 5 MG tablet Take 5 mg by mouth daily as needed for allergies.    Marland Kitchen oxyCODONE-acetaminophen (PERCOCET/ROXICET) 5-325 MG tablet Take 1-2 tablets by mouth every 3  (three) hours as needed for severe pain. (Patient not taking: Reported on 05/29/2016) 30 tablet 0   No current facility-administered medications on file prior to visit.     Past Surgical History:  Procedure Laterality Date  . CERVICAL BIOPSY    . MYOMECTOMY     sr did surgery  . MYOMECTOMY N/A 09/08/2015   Procedure: ABDOMINAL MYOMECTOMY;  Surgeon: Delsa Bern, MD;  Location: [facility];  Service: Gynecology;  Laterality: N/A;  . UTERINE ARTERY EMBOLIZATION      Allergies  Allergen Reactions  . Pollen Extract Itching    Pollen make pt eyes itch.  lm    Social History   Social History  . Marital status: Divorced    Spouse name: N/A  . Number of children: 0  . Years of education: N/A   Occupational History  . [employer] [place] History Main Topics  . Smoking status: Never Smoker  . Smokeless tobacco: Never Used  . Alcohol use No  . Drug use: No  . Sexual activity: No   Other Topics Concern  . Not on file   Social History Narrative  . No narrative on file    Family History  Problem Relation Age of Onset  . Hypertension Mother   . Hypertension Brother   . Diabetes Maternal Grandmother   . Diabetes Maternal Aunt   . Diabetes Maternal Aunt   .  Diabetes Maternal Aunt     BP 138/77   Pulse 75   Ht 5\' 3"  (1.6 m)   Wt 188 lb (85.3 kg)   BMI 33.30 kg/m   Review of Systems: See HPI above.     Objective:  Physical Exam:  Gen: NAD, comfortable in exam room  Left knee: No gross deformity, ecchymoses, effusion.  VMO atrophy Mild TTP post patellar facets and medial > lateral joint lines. FROM. Negative ant/post drawers. Negative valgus/varus testing. Negative lachmanns. Hip abduction 5/5, improved. Negative mcmurrays, apleys, patellar apprehension. NV intact distally.  Right knee: FROM without pain currently.   Assessment & Plan:  1. Left knee pain - history and exam consistent with patellofemoral syndrome and  arthritis.  Mild clinical improvement following 4 visits of PT.  Strength improved with hip abduction.  She had microfracture surgery of medial femoral condyle in December.  Repeat MRI showed cartilage loss medially with underlying marrow edema; possible she has a small tear of lateral meniscus.  Continue with physical therapy, home exercises.  Again reviewed tylenol, topical medications, aleve, some supplements that may help.  Consider cortisone, supartz series.  Arch supports, avoid flat shoes and barefoot walking.  Heat/ice.  F/u in 6 weeks.

## 2016-08-18 ENCOUNTER — Ambulatory Visit: Payer: 59 | Admitting: Family Medicine

## 2016-08-25 ENCOUNTER — Encounter: Payer: Self-pay | Admitting: Family Medicine

## 2016-08-25 ENCOUNTER — Ambulatory Visit (INDEPENDENT_AMBULATORY_CARE_PROVIDER_SITE_OTHER): Payer: 59 | Admitting: Family Medicine

## 2016-08-25 DIAGNOSIS — M25562 Pain in left knee: Secondary | ICD-10-CM

## 2016-08-25 NOTE — Patient Instructions (Signed)
Your pain is due to patellofemoral syndrome and arthritis. Do the home exercises daily (straight leg raises, straight leg raises with foot turned outwards, hip side raises) 3 sets of 10 once a day. Start physical therapy at [facility] and go 1-2 times a week. Arch supports are important - avoid flat shoes, barefoot walking as much as possible. Cortisone injections are an option. If cortisone injections do not help, there are different types of shots (gel shots) that may help but they take longer to take effect. It's important that you continue to stay active. Heat or ice 15 minutes at a time 3-4 times a day as needed to help with pain. Follow up with me in 6 weeks for reevaluation

## 2016-08-28 NOTE — Progress Notes (Signed)
PCP: Maury Dus, MD  Subjective:   HPI: Patient is a 46 y.o. female here for left knee pain.  5/3: Patient reports back in February 2017 she fell in a parking lot and injured her left knee. She continued to have pain despite physical therapy, prednisone, NSAIDs, KT tape. She then had surgery in December - microfracture of defect in medial femoral condyle. She feels symptoms have worsened since then. Difficulty walking inclines, with prolonged standing. States anterior knee 'feels funny' Pain level is 3/10, can be sharp. No skin changes. She does report falling off bed in recovery area on day of knee surgery also. Some numbness into left foot.  6/15: Patient reports she is doing a little better compared to last visit. Pain level 2/10. Has only been to 4 visits of PT so far but feels it is helping - would like to try a more holistic PT place though. Doing home exercises. No skin changes, numbness.  8/3: Patient reports she doesn't feel better compared to last visit. She has not done physical therapy this time though - feels like she's just always going to have pain in this knee. Feels 2/10, a stiffness anteriorly. Trouble going down inclines and stairs especially. No skin changes, numbness.  Past Medical History:  Diagnosis Date  . Anemia   . Chest pain   . Costal chondritis   . Costochondritis    Acute  . Costochondritis   . Hx of myomectomy 09-30-2008   sr did the surgery  . Oth tear of unsp mensc, current injury, unsp knee, sequela   . Swine flu 2009  . Uterine fibroid     Current Outpatient Prescriptions on File Prior to Visit  Medication Sig Dispense Refill  . AZURETTE 0.15-0.02/0.01 MG (21/5) tablet TK 1 T PO QD  4  . desogestrel-ethinyl estradiol (APRI,EMOQUETTE,SOLIA) 0.15-30 MG-MCG tablet Take 1 tablet by mouth daily.    . FeAsp-B12-FA-C-DSS-SuccAc-Zn (FERIVA 21/7) 75-1 MG TABS Take 1 tablet by mouth every morning. (Patient not taking: Reported on  09/17/2015) 28 tablet 1  . ibuprofen (ADVIL,MOTRIN) 600 MG tablet Take 600 mg by mouth every 6 (six) hours as needed.    Marland Kitchen levocetirizine (XYZAL) 5 MG tablet Take 5 mg by mouth daily as needed for allergies.     No current facility-administered medications on file prior to visit.     Past Surgical History:  Procedure Laterality Date  . CERVICAL BIOPSY    . MYOMECTOMY     sr did surgery  . MYOMECTOMY N/A 09/08/2015   Procedure: ABDOMINAL MYOMECTOMY;  Surgeon: Delsa Bern, MD;  Location: [facility];  Service: Gynecology;  Laterality: N/A;  . UTERINE ARTERY EMBOLIZATION      Allergies  Allergen Reactions  . Pollen Extract Itching    Pollen make pt eyes itch.  lm    Social History   Social History  . Marital status: Divorced    Spouse name: N/A  . Number of children: 0  . Years of education: N/A   Occupational History  . [employer] [place] History Main Topics  . Smoking status: Never Smoker  . Smokeless tobacco: Never Used  . Alcohol use No  . Drug use: No  . Sexual activity: No   Other Topics Concern  . Not on file   Social History Narrative  . No narrative on file    Family History  Problem Relation Age of Onset  . Hypertension Mother   . Hypertension Brother   .  Diabetes Maternal Grandmother   . Diabetes Maternal Aunt   . Diabetes Maternal Aunt   . Diabetes Maternal Aunt     BP 115/82   Pulse 80   Ht 5\' 3"  (1.6 m)   Wt 188 lb (85.3 kg)   BMI 33.30 kg/m   Review of Systems: See HPI above.     Objective:  Physical Exam:  Gen: NAD, comfortable in exam room  Left knee: No gross deformity, ecchymoses, effusion.  VMO atrophy Mild TTP post patellar facets and medial joint line. FROM. Negative ant/post drawers. Negative valgus/varus testing. Negative lachmanns. Hip abduction 5-/5. Negative mcmurrays, apleys, patellar apprehension. NV intact distally.  Right knee: FROM without pain currently.   Assessment &  Plan:  1. Left knee pain - history and exam consistent with patellofemoral syndrome and arthritis.  S/p microfracture medial femoral condyle 12/2015.  She initially had mild improvement with PT but has stopped - she is willing to go regularly for next 4-6 weeks before we consider injections.  Arch supports, avoid flat shoes and barefoot walking.  Heat/ice.  Tylenol, aleve if needed.  F/u in 6 weeks.

## 2016-08-28 NOTE — Assessment & Plan Note (Signed)
history and exam consistent with patellofemoral syndrome and arthritis.  S/p microfracture medial femoral condyle 12/2015.  She initially had mild improvement with PT but has stopped - she is willing to go regularly for next 4-6 weeks before we consider injections.  Arch supports, avoid flat shoes and barefoot walking.  Heat/ice.  Tylenol, aleve if needed.  F/u in 6 weeks.

## 2016-10-06 ENCOUNTER — Ambulatory Visit: Payer: 59 | Admitting: Family Medicine

## 2016-10-17 ENCOUNTER — Ambulatory Visit (INDEPENDENT_AMBULATORY_CARE_PROVIDER_SITE_OTHER): Payer: PRIVATE HEALTH INSURANCE | Admitting: Family Medicine

## 2016-10-17 ENCOUNTER — Encounter: Payer: Self-pay | Admitting: Family Medicine

## 2016-10-17 DIAGNOSIS — M25562 Pain in left knee: Secondary | ICD-10-CM

## 2016-10-17 NOTE — Patient Instructions (Signed)
Your exam is reassuring that the fall just caused a knee contusion and should only set you back 2-3 weeks. Your main issue with knee pain is due to patellofemoral syndrome and arthritis. Continue the home exercises daily (straight leg raises, straight leg raises with foot turned outwards, hip side raises) 3 sets of 10 once a day. Let me know if you want to revisit physical therapy. Arch supports are important - avoid flat shoes, barefoot walking as much as possible. Cortisone injections are an option. If cortisone injections do not help, there are different types of shots (gel shots) that may help but they take longer to take effect. Consider knee brace or KT taping. It's important that you continue to stay active. Heat or ice 15 minutes at a time 3-4 times a day as needed to help with pain. Follow up with me as needed.

## 2016-10-21 NOTE — Progress Notes (Signed)
PCP: Maury Dus, MD  Subjective:   HPI: Patient is a 46 y.o. female here for left knee pain.  5/3: Patient reports back in February 2017 she fell in a parking lot and injured her left knee. She continued to have pain despite physical therapy, prednisone, NSAIDs, KT tape. She then had surgery in December - microfracture of defect in medial femoral condyle. She feels symptoms have worsened since then. Difficulty walking inclines, with prolonged standing. States anterior knee 'feels funny' Pain level is 3/10, can be sharp. No skin changes. She does report falling off bed in recovery area on day of knee surgery also. Some numbness into left foot.  6/15: Patient reports she is doing a little better compared to last visit. Pain level 2/10. Has only been to 4 visits of PT so far but feels it is helping - would like to try a more holistic PT place though. Doing home exercises. No skin changes, numbness.  8/3: Patient reports she doesn't feel better compared to last visit. She has not done physical therapy this time though - feels like she's just always going to have pain in this knee. Feels 2/10, a stiffness anteriorly. Trouble going down inclines and stairs especially. No skin changes, numbness.  9/25: Patient reports overall she is doing well but on Sunday she was walking her dog. Another dog went to attack her dog and she intervened, falling directly onto left knee. Pain up to 2/10, anterior, can be sharp. Better with ibuprofen. No skin changes, numbness.  Past Medical History:  Diagnosis Date  . Anemia   . Chest pain   . Costal chondritis   . Costochondritis    Acute  . Costochondritis   . Hx of myomectomy 09-30-2008   sr did the surgery  . Oth tear of unsp mensc, current injury, unsp knee, sequela   . Swine flu 2009  . Uterine fibroid     Current Outpatient Prescriptions on File Prior to Visit  Medication Sig Dispense Refill  . AZURETTE 0.15-0.02/0.01 MG (21/5)  tablet TK 1 T PO QD  4  . Clobetasol Propionate 0.05 % shampoo APP TOPICALLY D FOR 28 DAYS  0  . desogestrel-ethinyl estradiol (APRI,EMOQUETTE,SOLIA) 0.15-30 MG-MCG tablet Take 1 tablet by mouth daily.    . FeAsp-B12-FA-C-DSS-SuccAc-Zn (FERIVA 21/7) 75-1 MG TABS Take 1 tablet by mouth every morning. (Patient not taking: Reported on 09/17/2015) 28 tablet 1  . fluticasone (FLONASE) 50 MCG/ACT nasal spray     . GYNAZOLE-1 2 % CREA     . ibuprofen (ADVIL,MOTRIN) 600 MG tablet Take 600 mg by mouth every 6 (six) hours as needed.    Marland Kitchen ipratropium (ATROVENT) 0.06 % nasal spray     . levocetirizine (XYZAL) 5 MG tablet Take 5 mg by mouth daily as needed for allergies.     No current facility-administered medications on file prior to visit.     Past Surgical History:  Procedure Laterality Date  . CERVICAL BIOPSY    . MYOMECTOMY     sr did surgery  . MYOMECTOMY N/A 09/08/2015   Procedure: ABDOMINAL MYOMECTOMY;  Surgeon: Delsa Bern, MD;  Location: [facility];  Service: Gynecology;  Laterality: N/A;  . UTERINE ARTERY EMBOLIZATION      Allergies  Allergen Reactions  . Hydrocodone   . Pollen Extract Itching    Pollen make pt eyes itch.  lm    Social History   Social History  . Marital status: Divorced    Spouse name: N/A  .  Number of children: 0  . Years of education: N/A   Occupational History  . [employer] [place] History Main Topics  . Smoking status: Never Smoker  . Smokeless tobacco: Never Used  . Alcohol use No  . Drug use: No  . Sexual activity: No   Other Topics Concern  . Not on file   Social History Narrative  . No narrative on file    Family History  Problem Relation Age of Onset  . Hypertension Mother   . Hypertension Brother   . Diabetes Maternal Grandmother   . Diabetes Maternal Aunt   . Diabetes Maternal Aunt   . Diabetes Maternal Aunt     BP 127/81   Pulse 79   Ht 5\' 3"  (1.6 m)   Wt 188 lb (85.3 kg)   BMI 33.30  kg/m   Review of Systems: See HPI above.     Objective:  Physical Exam:  Gen: NAD, comfortable in exam room.  Left knee: No gross deformity, ecchymoses, effusion.  VMO atrophy. Mild TTP post patellar facets and anterior patella. FROM. Negative ant/post drawers. Negative valgus/varus testing. Negative lachmanns. Negative mcmurrays, apleys, patellar apprehension. NV intact distally.  Right knee: FROM without pain.  Assessment & Plan:  1. Left knee pain - Contusion on top of patellofemoral syndrome and arthritis.  S/p microfracture medial femoral condyle 12/2015.  Reassured patient.  She will continue home exercises, arch supports.  Consider injections, brace, KT taping.  Heat or ice.  Ibuprofen if needed.  F/u prn.

## 2016-10-21 NOTE — Assessment & Plan Note (Signed)
Contusion on top of patellofemoral syndrome and arthritis.  S/p microfracture medial femoral condyle 12/2015.  Reassured patient.  She will continue home exercises, arch supports.  Consider injections, brace, KT taping.  Heat or ice.  Ibuprofen if needed.  F/u prn.

## 2017-08-23 IMAGING — MR MR KNEE*L* W/O CM
4 of 5 series · 30 of 40 positions shown · non-contrast
Comparison: None.

CLINICAL DATA: Patient injured knee 4 months ago when she fell off
a surgical hold table. It hurts worse when she stands for awhile,
walking downhill, walking stairs or when she runs.

EXAM:
MRI OF THE LEFT KNEE WITHOUT CONTRAST
TECHNIQUE: Multiplanar, multisequence MR imaging of the knee was performed. No
intravenous contrast was administered.

[Series 6: PD fat-sat · axial · left · 3.0mm · 0.42mm/px · z∈[-84,+31]mm · 9 of 36 slices shown (1 of 3)]
[im 1/36]
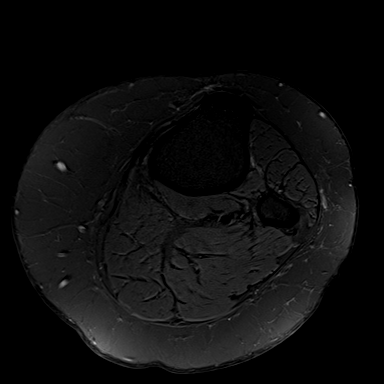
[im 5/36]
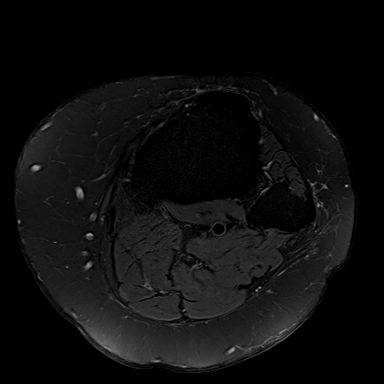
[im 9/36]
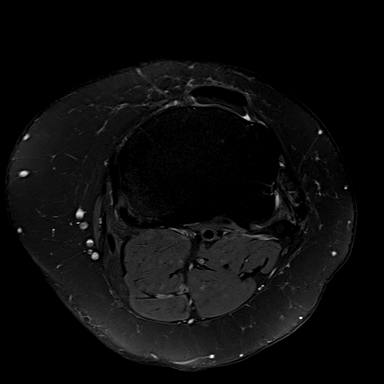
[im 14/36]
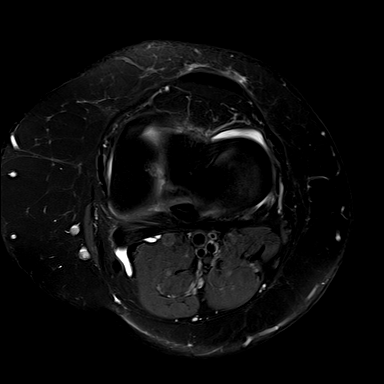
[im 18/36]
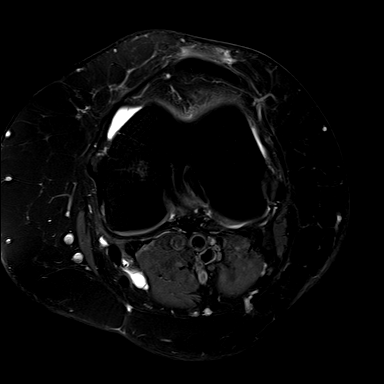
[im 22/36]
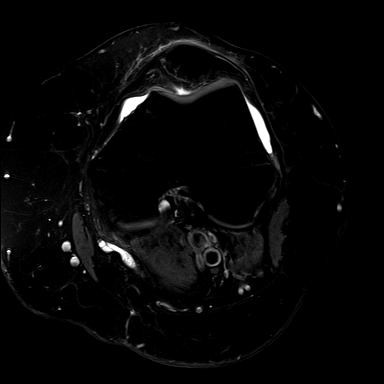
[im 27/36]
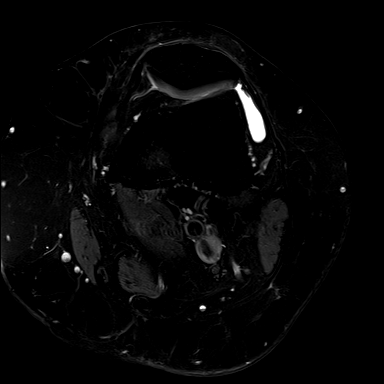
[im 31/36]
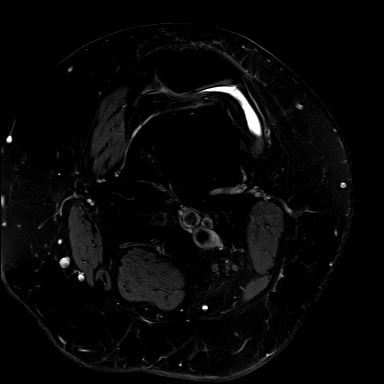
[im 36/36]
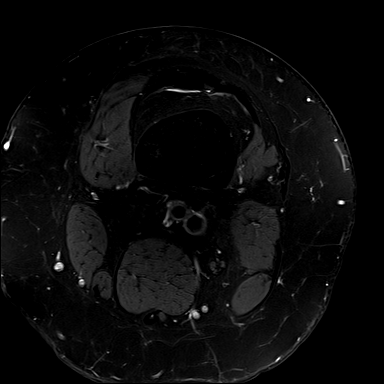

[Series 8: PD fat-sat · coronal · left · 3.0mm · 0.33mm/px · 8 of 33 slices shown (2 of 3)]
[im 1/33]
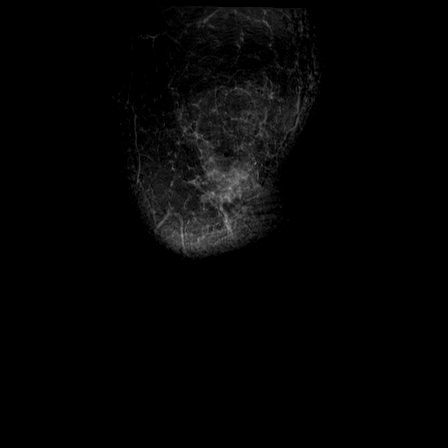
[im 5/33]
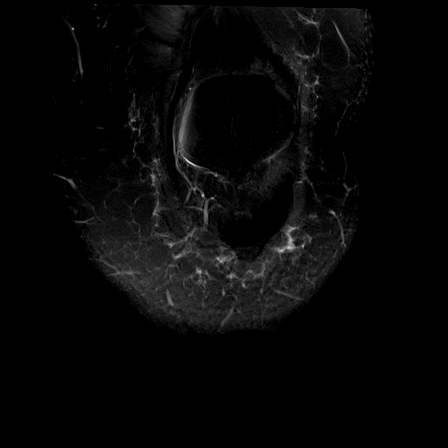
[im 10/33]
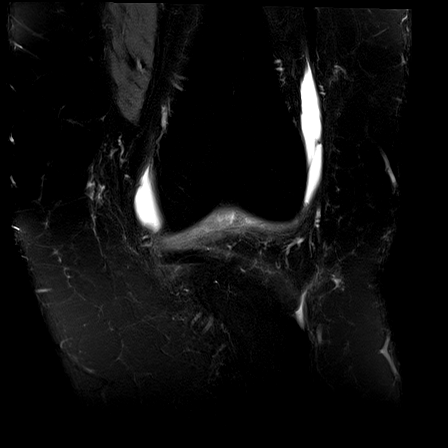
[im 14/33]
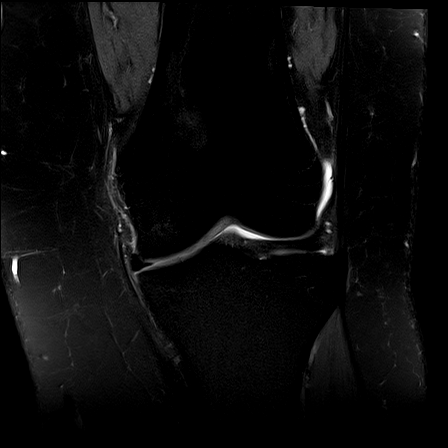
[im 19/33]
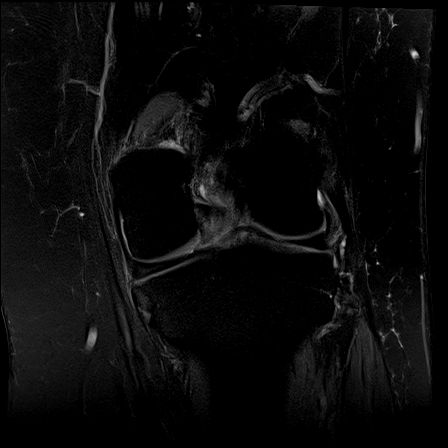
[im 23/33]
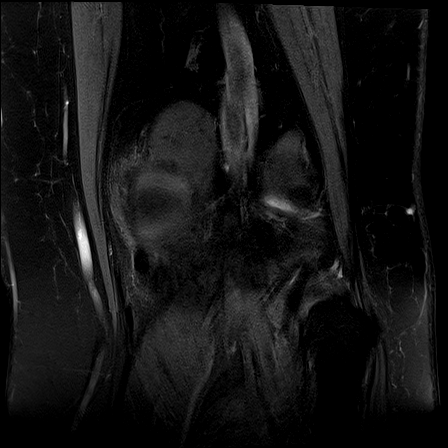
[im 28/33]
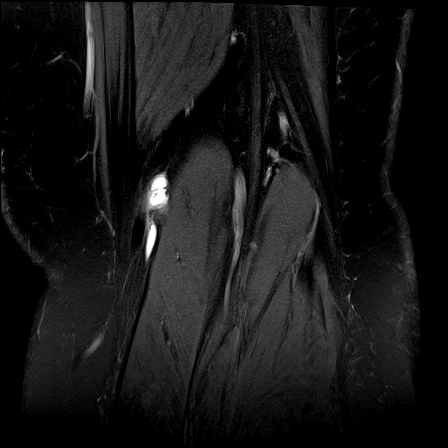
[im 33/33]
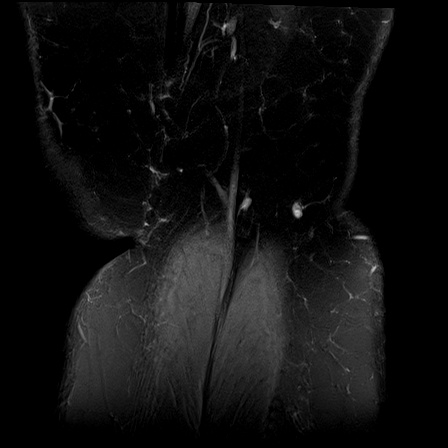

[Series 9: PD fat-sat · sagittal · left · 3.0mm · 0.42mm/px · 7 of 27 slices shown (3 of 3)]
[im 1/27]
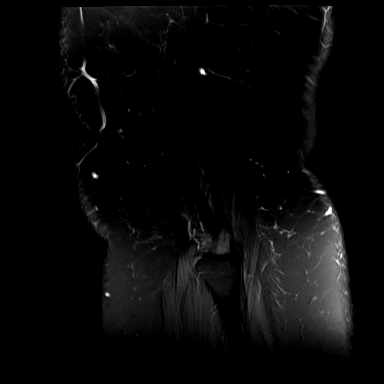
[im 5/27]
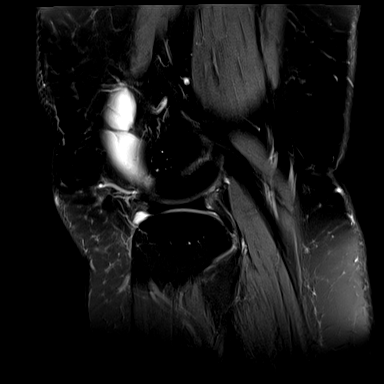
[im 9/27]
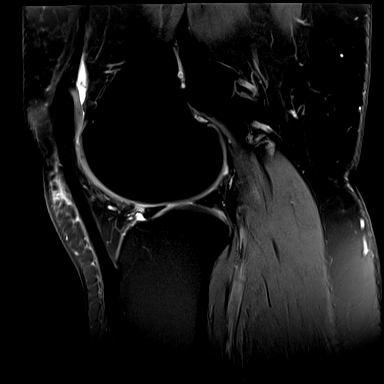
[im 14/27]
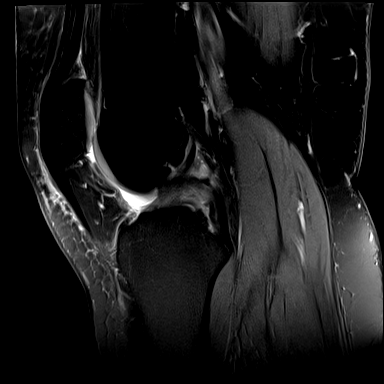
[im 18/27]
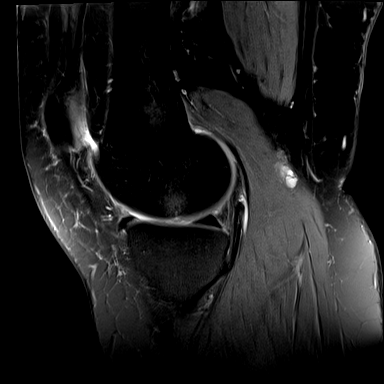
[im 22/27]
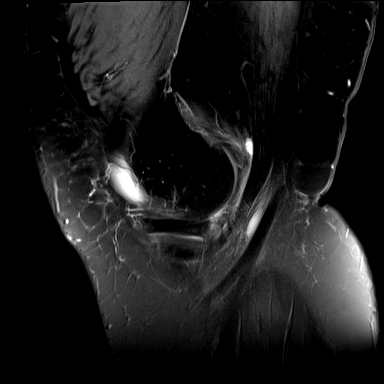
[im 27/27]
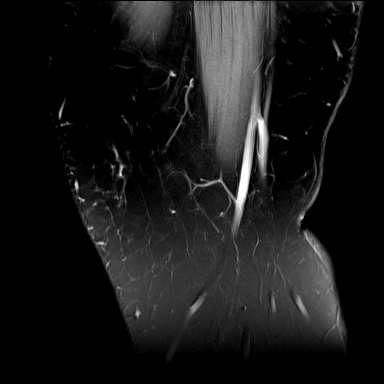

[Series 10: T2 fat-sat · coronal · left · 3.0mm · 0.39mm/px · 6 of 33 slices shown]
[im 1/33]
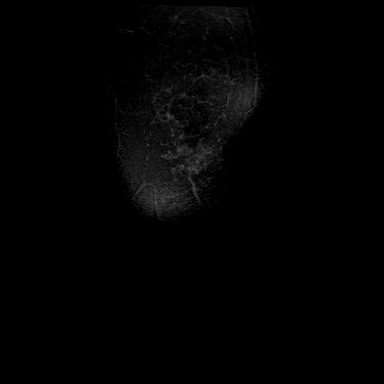
[im 5/33]
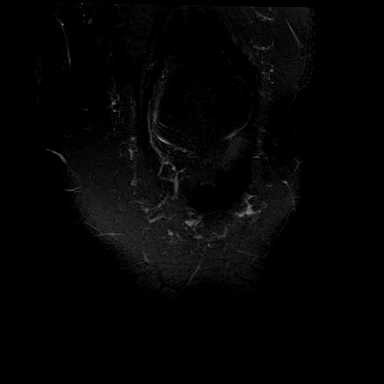
[im 10/33]
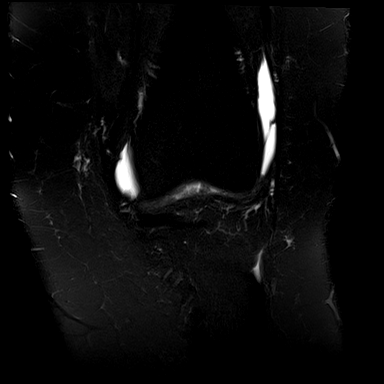
[im 14/33]
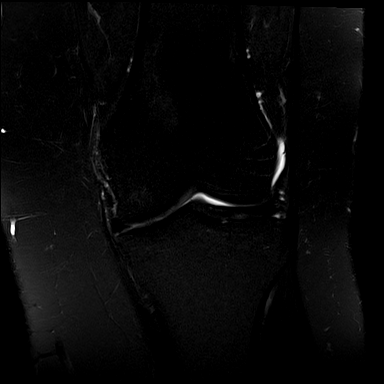
[im 19/33]
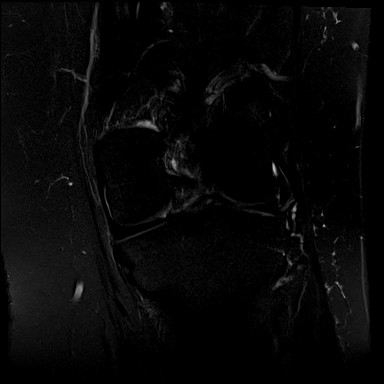
[im 28/33]
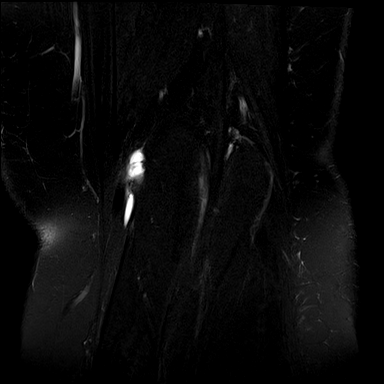

[30 of 40 positions shown; findings below may reference images not displayed]

FINDINGS: MENISCI

Medial meniscus:  Intact.

Lateral meniscus: Increased signal at the root of the posterior horn
of the lateral meniscus consistent with severe degeneration with a
possible small partial undersurface tear.

LIGAMENTS

Cruciates: Intact ACL and PCL. Mild increased signal and expansion
of the ACL and PCL likely reflecting mild mucinous degeneration.

Collaterals: Medial collateral ligament is intact. Lateral
collateral ligament complex is intact.

CARTILAGE

Patellofemoral:  No chondral defect.

Medial: Partial-thickness cartilage loss of the medial femoral
condyle and medial tibial plateau with a small area of high-grade
partial-thickness cartilage loss/full-thickness cartilage loss of
the weight-bearing surface of the medial femoral condyles measuring
7 mm with subchondral reactive marrow edema.

Lateral: Mild partial-thickness cartilage loss of the lateral
femoral condyle and lateral tibial plateau.

Joint: No joint effusion. Mild edema in Hoffa's fat. No plical
thickening.

Popliteal Fossa:  Small Baker cyst.  Intact popliteus tendon.

Extensor Mechanism: Intact quadriceps tendon and patellar tendon.
Intact medial and lateral patellar retinaculum. Intact MPFL.

Bones: No other focal marrow signal abnormality. No acute fracture
or dislocation.

Other: No fluid collection or hematoma.
IMPRESSION: 1. Increased signal at the root of the posterior horn of the lateral
meniscus consistent with severe degeneration with a possible small
partial undersurface tear.
2. Partial-thickness cartilage loss of the medial femoral condyle
and medial tibial plateau with a small area of high-grade
partial-thickness cartilage loss/full-thickness cartilage loss of
the weight-bearing surface of the medial femoral condyles measuring
7 mm with subchondral reactive marrow edema.
3. Mild partial-thickness cartilage loss of the lateral femoral
condyle and lateral tibial plateau.

## 2018-02-25 ENCOUNTER — Emergency Department (INDEPENDENT_AMBULATORY_CARE_PROVIDER_SITE_OTHER): Payer: Managed Care, Other (non HMO)

## 2018-02-25 ENCOUNTER — Emergency Department
Admission: EM | Admit: 2018-02-25 | Discharge: 2018-02-25 | Disposition: A | Discharge status: Home / Self Care | Payer: Managed Care, Other (non HMO) | Source: Home / Self Care | Attending: Emergency Medicine | Admitting: Emergency Medicine

## 2018-02-25 ENCOUNTER — Encounter: Payer: Self-pay | Admitting: *Deleted

## 2018-02-25 DIAGNOSIS — R059 Cough, unspecified: Secondary | ICD-10-CM

## 2018-02-25 DIAGNOSIS — R05 Cough: Secondary | ICD-10-CM | POA: Diagnosis not present

## 2018-02-25 DIAGNOSIS — R0789 Other chest pain: Secondary | ICD-10-CM | POA: Diagnosis not present

## 2018-02-25 DIAGNOSIS — R079 Chest pain, unspecified: Secondary | ICD-10-CM | POA: Diagnosis not present

## 2018-02-25 DIAGNOSIS — M546 Pain in thoracic spine: Secondary | ICD-10-CM

## 2018-02-25 DIAGNOSIS — J8489 Other specified interstitial pulmonary diseases: Secondary | ICD-10-CM | POA: Diagnosis not present

## 2018-02-25 LAB — POCT CBC W AUTO DIFF (K'VILLE URGENT CARE)

## 2018-02-25 LAB — POCT FASTING CBG KUC MANUAL ENTRY: POCT Glucose (KUC): 82 mg/dL (ref 70–99)

## 2018-02-25 MED ORDER — IBUPROFEN 600 MG PO TABS: 600.0000 mg | ORAL_TABLET | Freq: Three times a day (TID) | ORAL | 0 refills | Status: AC

## 2018-02-25 MED ORDER — FLUCONAZOLE 150 MG PO TABS: 150.0000 mg | ORAL_TABLET | Freq: Once | ORAL | 0 refills | Status: AC

## 2018-02-25 MED ORDER — ALBUTEROL SULFATE HFA 108 (90 BASE) MCG/ACT IN AERS: 1.0000 | INHALATION_SPRAY | Freq: Four times a day (QID) | RESPIRATORY_TRACT | 0 refills | Status: DC | PRN

## 2018-02-25 MED ORDER — DOXYCYCLINE HYCLATE 100 MG PO CAPS: 100.0000 mg | ORAL_CAPSULE | Freq: Two times a day (BID) | ORAL | 0 refills | Status: DC

## 2018-02-25 NOTE — ED Provider Notes (Signed)
[name]    CSN: [id number] Arrival date & time: 02/25/18  1110     History   Chief Complaint Chief Complaint  Patient presents with  . Chest Pain  . Cough    HPI [name] is a 48 y.o. female.   HPI Patient states she was in her usual state of health until Christmas when she had a flulike illness.  She did not go to the doctor at that time but states she had flu symptoms with head congestion sore throat and cough.  She states that since that time she has had a discomfort in her chest and upper back.  It does hurt to move or take a deep breath.  She denies any pain or swelling in her legs.  She has not had any recent travel.  She did have a chest x-ray done 2 years ago which was normal.  Of note she is currently on birth control pills.  She has had a mild cough which has been nonproductive.  She has not had any hemoptysis.  She was seen at [facility] and had a chest x-ray done but did not receive the results.  She was treated with prednisone but this did not seem to help. Past Medical History:  Diagnosis Date  . Anemia   . Chest pain   . Costal chondritis   . Costochondritis    Acute  . Costochondritis   . Hx of myomectomy 09-30-2008   sr did the surgery  . Oth tear of unsp mensc, current injury, unsp knee, sequela   . Swine flu 2009  . Uterine fibroid     Patient Active Problem List   Diagnosis Date Noted  . Left knee pain 05/28/2016  . Fibroids 09/08/2015  . 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 Seasonal allergies 06/10/2012  . Nocturia 12/01/2011  . Uterine fibroid   . Swine flu   . Costochondritis 05/03/2011  . Hx of myomectomy 09/30/2008    Past Surgical History:  Procedure Laterality Date  . CERVICAL BIOPSY    . MYOMECTOMY     sr did surgery  . MYOMECTOMY N/A 09/08/2015   Procedure: ABDOMINAL MYOMECTOMY;  Surgeon: Delsa Bern, MD;  Location: [facility];  Service: Gynecology;  Laterality: N/A;  . UTERINE ARTERY  EMBOLIZATION      OB History    Gravida  1   Para  0   Term      Preterm      AB  1   Living  0     SAB  1   TAB      Ectopic      Multiple      Live Births               Home Medications    Prior to Admission medications   Medication Sig Start Date End Date Taking? Authorizing Provider  albuterol (PROVENTIL HFA;VENTOLIN HFA) 108 (90 Base) MCG/ACT inhaler Inhale 1-2 puffs into the lungs every 6 (six) hours as needed for wheezing or shortness of breath. 02/25/18   Darlyne Russian, MD  AZURETTE 0.15-0.02/0.01 MG (21/5) tablet TK 1 T PO QD 05/02/16   [provider]  Clobetasol Propionate 0.05 % shampoo APP TOPICALLY D FOR 28 DAYS 08/14/16   [provider]  desogestrel-ethinyl estradiol (APRI,EMOQUETTE,SOLIA) 0.15-30 MG-MCG tablet Take 1 tablet by mouth daily.    [provider]  doxycycline (VIBRAMYCIN) 100 MG capsule Take 1 capsule (100  mg total) by mouth 2 (two) times daily. 02/25/18   Darlyne Russian, MD  FeAsp-B12-FA-C-DSS-SuccAc-Zn (FERIVA 21/7) 75-1 MG TABS Take 1 tablet by mouth every morning. Patient not taking: Reported on 09/17/2015 09/09/15   Delsa Bern, MD  fluconazole (DIFLUCAN) 150 MG tablet Take 1 tablet (150 mg total) by mouth once for 1 dose. Repeat if needed 02/25/18 02/25/18  Darlyne Russian, MD  fluticasone Asencion Islam) 50 MCG/ACT nasal spray  08/22/16   [provider]  GYNAZOLE-1 2 % CREA  07/29/16   [provider]  ibuprofen (ADVIL,MOTRIN) 600 MG tablet Take 1 tablet (600 mg total) by mouth 3 (three) times daily. 02/25/18   Darlyne Russian, MD  ipratropium (ATROVENT) 0.06 % nasal spray  08/22/16   [provider]  levocetirizine (XYZAL) 5 MG tablet Take 5 mg by mouth daily as needed for allergies.    [provider]    Family History Family History  Problem Relation Age of Onset  . Hypertension Mother   . Hypertension Brother   . Diabetes Maternal Grandmother   . Diabetes Maternal Aunt   .  Diabetes Maternal Aunt   . Diabetes Maternal Aunt     Social History Social History   Tobacco Use  . Smoking status: Never Smoker  . Smokeless tobacco: Never Used  Substance Use Topics  . Alcohol use: No  . Drug use: No     Allergies   Hydrocodone and Pollen extract   Review of Systems Review of Systems  Constitutional: Negative.   HENT: Negative.   Eyes: Negative.   Respiratory: Positive for cough and wheezing.   Cardiovascular: Positive for chest pain.  Gastrointestinal: Negative.   Musculoskeletal: Positive for back pain and myalgias.     Physical Exam Triage Vital Signs ED Triage Vitals  Enc Vitals Group     BP 02/25/18 1133 (!) 169/109     Pulse Rate 02/25/18 1133 95     Resp 02/25/18 1133 16     Temp 02/25/18 1133 (!) 97.4 F (36.3 C)     Temp Source 02/25/18 1133 Oral     SpO2 02/25/18 1133 98 %     Weight 02/25/18 1134 220 lb (99.8 kg)     Height 02/25/18 1134 5\' 3"  (1.6 m)     Head Circumference --      Peak Flow --      Pain Score 02/25/18 1133 6     Pain Loc --      Pain Edu? --      Excl. in [place]? --    No data found.  Updated Vital Signs BP (!) 169/109 (BP Location: Right Arm)   Pulse 95   Temp (!) 97.4 F (36.3 C) (Oral)   Resp 16   Ht 5\' 3"  (1.6 m)   Wt 99.8 kg   SpO2 98%   BMI 38.97 kg/m   Visual Acuity Right Eye Distance:   Left Eye Distance:   Bilateral Distance:    Right Eye Near:   Left Eye Near:    Bilateral Near:     Physical Exam Constitutional:      Appearance: She is well-developed.  HENT:     Head: Normocephalic.  Eyes:     Pupils: Pupils are equal, round, and reactive to light.  Neck:     Musculoskeletal: Normal range of motion.  Cardiovascular:     Rate and Rhythm: Normal rate and regular rhythm.     Heart sounds: Normal heart  sounds.  Pulmonary:     Effort: Pulmonary effort is normal.     Breath sounds: Normal breath sounds.  Chest:     Comments: There is diffuse tenderness over the anterior chest  wall.  She is tender at all of the costal sternal junctions anteriorly.  Also she is tender posteriorly over the parathoracic muscles from the neck down to the flank area. Neurological:     Mental Status: She is alert.    Repeat blood pressure 137/86.  UC Treatments / Results  Labs (all labs ordered are listed, but only abnormal results are displayed) Labs Reviewed  CK  SEDIMENTATION RATE  COMPLETE METABOLIC PANEL WITH GFR  POCT CBC W AUTO DIFF ([facility])  POCT FASTING CBG KUC MANUAL ENTRY    EKG Normal sinus rhythm, question left atrial enlargement, 1 PVC seen on 1 of the tracings.  No acute changes. Radiology Dg Chest 2 View  Result Date: 02/25/2018 CLINICAL DATA:  Cough, chest pain EXAM: CHEST - 2 VIEW COMPARISON:  None. FINDINGS: The heart size and mediastinal contours are within normal limits. Minimal diffuse interstitial pulmonary opacity. The visualized skeletal structures are unremarkable. IMPRESSION: Minimal diffuse interstitial pulmonary opacity, which may reflect atypical or viral infection. There is no focal airspace opacity. Electronically Signed   By: Eddie Candle M.D.   On: 02/25/2018 12:15    Procedures Procedures (including critical care time)  Medications Ordered in UC Medications - No data to display  Initial Impression / Assessment and Plan / UC Course  I have reviewed the triage vital signs and the nursing notes.  Pertinent labs & imaging results that were available during my care of the patient were reviewed by me and considered in my medical decision making (see chart for details). Patient's heart and lung exam are normal.  She does not have any calf tenderness.  She is diffusely tender over the muscles of her upper back as well as over the anterior chest wall.  We will proceed with chest x-ray and EKG and proceed with checking a blood count, CK, and sed rate. Chest x-ray showed diffuse interstitial pulmonary opacities consistent with an atypical  or viral type infection.  Her white count is 6900 with a normal differential hemoglobin 12.7 and platelets 208,000.  EKG was unremarkable for acute changes.  I suspect she had a flulike illness and these are residual changes.  Will cover with doxycycline, with ibuprofen to take for chest wall pain.  She would like an albuterol inhaler to have for backup as she has had some issues with reactive airways disease in the past.  I did not hear any wheezing when she was here but certainly that could come and go with the interstitial changes on her chest x-ray.  She will follow-up with her family doctor in the next week.  She was also given Diflucan for secondary yeast infection.     Final Clinical Impressions(s) / UC Diagnoses   Final diagnoses:  Chest wall pain  Cough  Acute bilateral thoracic back pain  Interstitial pneumonitis [facility])     Discharge Instructions     Please take medications as instructed. We will call you with blood test results. Please make an appointment to follow-up with your PCP.     ED Prescriptions    Medication Sig Dispense Auth. Provider   doxycycline (VIBRAMYCIN) 100 MG capsule Take 1 capsule (100 mg total) by mouth 2 (two) times daily. 20 capsule Darlyne Russian, MD   ibuprofen (  ADVIL,MOTRIN) 600 MG tablet Take 1 tablet (600 mg total) by mouth 3 (three) times daily. 21 tablet Darlyne Russian, MD   fluconazole (DIFLUCAN) 150 MG tablet Take 1 tablet (150 mg total) by mouth once for 1 dose. Repeat if needed 2 tablet Darlyne Russian, MD   albuterol (PROVENTIL HFA;VENTOLIN HFA) 108 (90 Base) MCG/ACT inhaler Inhale 1-2 puffs into the lungs every 6 (six) hours as needed for wheezing or shortness of breath. 1 Inhaler Darlyne Russian, MD     Controlled Substance Prescriptions [place] Controlled Substance Registry consulted? Not Applicable   Darlyne Russian, MD 02/25/18 1249

## 2018-02-25 NOTE — Discharge Instructions (Signed)
Please take medications as instructed. We will call you with blood test results. Please make an appointment to follow-up with your PCP.

## 2018-02-25 NOTE — ED Triage Notes (Signed)
Recent flu with excessive coughing. Flu has resolved but pt now c/o chest soreness and pain with coughing, resp, and movement.

## 2018-02-26 ENCOUNTER — Telehealth: Payer: Self-pay

## 2018-02-26 LAB — COMPLETE METABOLIC PANEL WITH GFR
AG RATIO: 1.4 (calc) (ref 1.0–2.5)
ALT: 8 U/L (ref 6–29)
AST: 12 U/L (ref 10–35)
Albumin: 4 g/dL (ref 3.6–5.1)
Alkaline phosphatase (APISO): 79 U/L (ref 31–125)
BUN: 14 mg/dL (ref 7–25)
CALCIUM: 9.5 mg/dL (ref 8.6–10.2)
CO2: 24 mmol/L (ref 20–32)
CREATININE: 0.82 mg/dL (ref 0.50–1.10)
Chloride: 109 mmol/L (ref 98–110)
GFR, EST AFRICAN AMERICAN: 97 mL/min/{1.73_m2} (ref >=60)
GFR, EST NON AFRICAN AMERICAN: 84 mL/min/{1.73_m2} (ref >=60)
GLUCOSE: 89 mg/dL (ref 65–99)
Globulin: 2.9 g/dL (calc) (ref 1.9–3.7)
Potassium: 4.1 mmol/L (ref 3.5–5.3)
SODIUM: 142 mmol/L (ref 135–146)
TOTAL PROTEIN: 6.9 g/dL (ref 6.1–8.1)
Total Bilirubin: 0.3 mg/dL (ref 0.2–1.2)

## 2018-02-26 LAB — SEDIMENTATION RATE: Sed Rate: 33 mm/h — ABNORMAL HIGH (ref 0–20)

## 2018-02-26 LAB — CK: CK TOTAL: 75 U/L (ref 29–143)

## 2018-02-26 NOTE — Telephone Encounter (Signed)
Pt notified of lab results. Advised to f/u with PCP to repeat sed rate after shes feeling better.

## 2018-03-01 ENCOUNTER — Telehealth: Payer: Self-pay | Admitting: Emergency Medicine

## 2018-03-01 MED ORDER — AZITHROMYCIN 250 MG PO TABS: ORAL_TABLET | ORAL | 0 refills | Status: DC

## 2018-03-01 NOTE — Telephone Encounter (Signed)
Just received a message fromRN that patient called in, requesting change of antibiotic because she doesn't feel well and has headache on the doxycycline.  She was seen here by Dr. Everlene Farrier on February 3 for pneumonitis, chest x-ray consistent with interstitial pneumonitis and she was placed on doxycycline at that time and advised to follow up with PCP within one week.  Please advise patient to stop the doxycycline. I am electronically prescribing Zithromax Z-Pak to her pharmacy. Please stress to  the patient that she needs to follow-up with her PCP within 3-4 days, sooner if worse or new symptoms.

## 2018-03-01 NOTE — Telephone Encounter (Signed)
Pt called in regarding severe headache since starting doxycycline. She would like to stop it asap. Dr. Burnett Harry advised and recommended that pt f/up with PCP within 3-4 days if symptoms worsen or don't change. He also prescribed pt Z-pack and prescription was called in to pt's pharmacy on file. Pt was called back and understood Dr. Ardyth Gal recommendations. No other questions or concerns at this time.

## 2018-05-06 DIAGNOSIS — R918 Other nonspecific abnormal finding of lung field: Secondary | ICD-10-CM | POA: Diagnosis not present

## 2018-05-06 DIAGNOSIS — J309 Allergic rhinitis, unspecified: Secondary | ICD-10-CM | POA: Diagnosis not present

## 2018-05-06 DIAGNOSIS — H6590 Unspecified nonsuppurative otitis media, unspecified ear: Secondary | ICD-10-CM | POA: Diagnosis not present

## 2018-05-06 DIAGNOSIS — R05 Cough: Secondary | ICD-10-CM | POA: Diagnosis not present

## 2018-05-07 ENCOUNTER — Other Ambulatory Visit: Payer: Self-pay

## 2018-05-07 ENCOUNTER — Other Ambulatory Visit: Payer: Self-pay | Admitting: Family Medicine

## 2018-05-07 ENCOUNTER — Ambulatory Visit
Admission: RE | Admit: 2018-05-07 | Discharge: 2018-05-07 | Disposition: A | Discharge status: Home / Self Care | Payer: BLUE CROSS/BLUE SHIELD | Source: Ambulatory Visit | Attending: Family Medicine | Admitting: Family Medicine

## 2018-05-07 DIAGNOSIS — R918 Other nonspecific abnormal finding of lung field: Secondary | ICD-10-CM

## 2018-05-07 DIAGNOSIS — R030 Elevated blood-pressure reading, without diagnosis of hypertension: Secondary | ICD-10-CM | POA: Diagnosis not present

## 2018-05-07 DIAGNOSIS — R0602 Shortness of breath: Secondary | ICD-10-CM | POA: Diagnosis not present

## 2018-05-07 DIAGNOSIS — H6983 Other specified disorders of Eustachian tube, bilateral: Secondary | ICD-10-CM | POA: Diagnosis not present

## 2018-05-07 DIAGNOSIS — J302 Other seasonal allergic rhinitis: Secondary | ICD-10-CM | POA: Diagnosis not present

## 2018-05-21 DIAGNOSIS — H938X3 Other specified disorders of ear, bilateral: Secondary | ICD-10-CM | POA: Diagnosis not present

## 2018-05-21 DIAGNOSIS — H6983 Other specified disorders of Eustachian tube, bilateral: Secondary | ICD-10-CM | POA: Diagnosis not present

## 2018-05-29 DIAGNOSIS — J3489 Other specified disorders of nose and nasal sinuses: Secondary | ICD-10-CM | POA: Diagnosis not present

## 2018-05-29 DIAGNOSIS — H938X3 Other specified disorders of ear, bilateral: Secondary | ICD-10-CM | POA: Diagnosis not present

## 2018-05-29 DIAGNOSIS — J321 Chronic frontal sinusitis: Secondary | ICD-10-CM | POA: Diagnosis not present

## 2018-06-05 DIAGNOSIS — R51 Headache: Secondary | ICD-10-CM | POA: Diagnosis not present

## 2018-06-05 DIAGNOSIS — H938X3 Other specified disorders of ear, bilateral: Secondary | ICD-10-CM | POA: Diagnosis not present

## 2018-06-14 DIAGNOSIS — H43393 Other vitreous opacities, bilateral: Secondary | ICD-10-CM | POA: Diagnosis not present

## 2018-06-14 DIAGNOSIS — R51 Headache: Secondary | ICD-10-CM | POA: Diagnosis not present

## 2018-06-27 DIAGNOSIS — H938X3 Other specified disorders of ear, bilateral: Secondary | ICD-10-CM | POA: Diagnosis not present

## 2018-06-27 DIAGNOSIS — G43C0 Periodic headache syndromes in child or adult, not intractable: Secondary | ICD-10-CM | POA: Diagnosis not present

## 2018-07-15 DIAGNOSIS — Z03818 Encounter for observation for suspected exposure to other biological agents ruled out: Secondary | ICD-10-CM | POA: Diagnosis not present

## 2018-07-17 DIAGNOSIS — J069 Acute upper respiratory infection, unspecified: Secondary | ICD-10-CM | POA: Diagnosis not present

## 2018-07-18 DIAGNOSIS — J069 Acute upper respiratory infection, unspecified: Secondary | ICD-10-CM | POA: Diagnosis not present

## 2018-08-26 DIAGNOSIS — J019 Acute sinusitis, unspecified: Secondary | ICD-10-CM | POA: Diagnosis not present

## 2018-12-09 DIAGNOSIS — R6889 Other general symptoms and signs: Secondary | ICD-10-CM | POA: Diagnosis not present

## 2018-12-10 DIAGNOSIS — R6889 Other general symptoms and signs: Secondary | ICD-10-CM | POA: Diagnosis not present

## 2018-12-13 ENCOUNTER — Other Ambulatory Visit: Payer: Self-pay | Admitting: Family Medicine

## 2018-12-13 ENCOUNTER — Other Ambulatory Visit: Payer: Self-pay

## 2018-12-13 ENCOUNTER — Ambulatory Visit
Admission: RE | Admit: 2018-12-13 | Discharge: 2018-12-13 | Disposition: A | Discharge status: Home / Self Care | Payer: BC Managed Care – PPO | Source: Ambulatory Visit | Attending: Family Medicine | Admitting: Family Medicine

## 2018-12-13 DIAGNOSIS — R509 Fever, unspecified: Secondary | ICD-10-CM

## 2018-12-13 DIAGNOSIS — R6889 Other general symptoms and signs: Secondary | ICD-10-CM | POA: Diagnosis not present

## 2018-12-13 DIAGNOSIS — Z20828 Contact with and (suspected) exposure to other viral communicable diseases: Secondary | ICD-10-CM | POA: Diagnosis not present

## 2018-12-16 DIAGNOSIS — R399 Unspecified symptoms and signs involving the genitourinary system: Secondary | ICD-10-CM | POA: Diagnosis not present

## 2018-12-21 DIAGNOSIS — R5383 Other fatigue: Secondary | ICD-10-CM | POA: Diagnosis not present

## 2018-12-21 DIAGNOSIS — R300 Dysuria: Secondary | ICD-10-CM | POA: Diagnosis not present

## 2018-12-21 DIAGNOSIS — M791 Myalgia, unspecified site: Secondary | ICD-10-CM | POA: Diagnosis not present

## 2018-12-23 DIAGNOSIS — M791 Myalgia, unspecified site: Secondary | ICD-10-CM | POA: Diagnosis not present

## 2018-12-23 DIAGNOSIS — R5383 Other fatigue: Secondary | ICD-10-CM | POA: Diagnosis not present

## 2019-01-25 DIAGNOSIS — J302 Other seasonal allergic rhinitis: Secondary | ICD-10-CM | POA: Diagnosis not present

## 2019-01-25 DIAGNOSIS — H66001 Acute suppurative otitis media without spontaneous rupture of ear drum, right ear: Secondary | ICD-10-CM | POA: Diagnosis not present

## 2019-02-25 DIAGNOSIS — H938X3 Other specified disorders of ear, bilateral: Secondary | ICD-10-CM | POA: Diagnosis not present

## 2019-03-12 DIAGNOSIS — M791 Myalgia, unspecified site: Secondary | ICD-10-CM | POA: Diagnosis not present

## 2019-03-16 ENCOUNTER — Emergency Department (HOSPITAL_BASED_OUTPATIENT_CLINIC_OR_DEPARTMENT_OTHER): Payer: BC Managed Care – PPO

## 2019-03-16 ENCOUNTER — Emergency Department (HOSPITAL_BASED_OUTPATIENT_CLINIC_OR_DEPARTMENT_OTHER)
Admission: EM | Admit: 2019-03-16 | Discharge: 2019-03-16 | Disposition: A | Discharge status: Home / Self Care | Payer: BC Managed Care – PPO | Attending: Emergency Medicine | Admitting: Emergency Medicine

## 2019-03-16 ENCOUNTER — Other Ambulatory Visit: Payer: Self-pay

## 2019-03-16 ENCOUNTER — Encounter (HOSPITAL_BASED_OUTPATIENT_CLINIC_OR_DEPARTMENT_OTHER): Payer: Self-pay | Admitting: Emergency Medicine

## 2019-03-16 DIAGNOSIS — R0602 Shortness of breath: Secondary | ICD-10-CM | POA: Diagnosis not present

## 2019-03-16 DIAGNOSIS — Z885 Allergy status to narcotic agent status: Secondary | ICD-10-CM | POA: Diagnosis not present

## 2019-03-16 DIAGNOSIS — R0789 Other chest pain: Secondary | ICD-10-CM | POA: Diagnosis not present

## 2019-03-16 DIAGNOSIS — R079 Chest pain, unspecified: Secondary | ICD-10-CM | POA: Diagnosis not present

## 2019-03-16 DIAGNOSIS — Z20822 Contact with and (suspected) exposure to covid-19: Secondary | ICD-10-CM | POA: Insufficient documentation

## 2019-03-16 DIAGNOSIS — Z79899 Other long term (current) drug therapy: Secondary | ICD-10-CM | POA: Diagnosis not present

## 2019-03-16 LAB — CBC WITH DIFFERENTIAL/PLATELET
Abs Immature Granulocytes: 0.18 10*3/uL — ABNORMAL HIGH (ref 0.00–0.07)
Basophils Absolute: 0 10*3/uL (ref 0.0–0.1)
Basophils Relative: 0 %
Eosinophils Absolute: 0 10*3/uL (ref 0.0–0.5)
Eosinophils Relative: 0 %
HCT: 41.2 % (ref 36.0–46.0)
Hemoglobin: 13.7 g/dL (ref 12.0–15.0)
Immature Granulocytes: 1 %
Lymphocytes Relative: 9 %
Lymphs Abs: 1.4 10*3/uL (ref 0.7–4.0)
MCH: 29.7 pg (ref 26.0–34.0)
MCHC: 33.3 g/dL (ref 30.0–36.0)
MCV: 89.2 fL (ref 80.0–100.0)
Monocytes Absolute: 1 10*3/uL (ref 0.1–1.0)
Monocytes Relative: 7 %
Neutro Abs: 13.1 10*3/uL — ABNORMAL HIGH (ref 1.7–7.7)
Neutrophils Relative %: 83 %
Platelets: 256 10*3/uL (ref 150–400)
RBC: 4.62 MIL/uL (ref 3.87–5.11)
RDW: 12.5 % (ref 11.5–15.5)
WBC: 15.8 10*3/uL — ABNORMAL HIGH (ref 4.0–10.5)
nRBC: 0 % (ref 0.0–0.2)

## 2019-03-16 LAB — BASIC METABOLIC PANEL
Anion gap: 10 (ref 5–15)
BUN: 18 mg/dL (ref 6–20)
CO2: 20 mmol/L — ABNORMAL LOW (ref 22–32)
Calcium: 9.2 mg/dL (ref 8.9–10.3)
Chloride: 102 mmol/L (ref 98–111)
Creatinine, Ser: 0.82 mg/dL (ref 0.44–1.00)
GFR calc Af Amer: >60 mL/min (ref >60)
GFR calc non Af Amer: >60 mL/min (ref >60)
Glucose, Bld: 119 mg/dL — ABNORMAL HIGH (ref 70–99)
Potassium: 3.7 mmol/L (ref 3.5–5.1)
Sodium: 132 mmol/L — ABNORMAL LOW (ref 135–145)

## 2019-03-16 LAB — TROPONIN I (HIGH SENSITIVITY)
Troponin I (High Sensitivity): 10 ng/L
Troponin I (High Sensitivity): 11 ng/L

## 2019-03-16 LAB — D-DIMER, QUANTITATIVE: D-Dimer, Quant: 0.38 ug/mL-FEU (ref 0.00–0.50)

## 2019-03-16 MED ORDER — IOHEXOL 350 MG/ML SOLN
75.0000 mL | Freq: Once | INTRAVENOUS | Status: AC | PRN
  Administered 2019-03-16: 77 mL via INTRAVENOUS

## 2019-03-16 MED ORDER — FENTANYL CITRATE (PF) 100 MCG/2ML IJ SOLN
50.0000 ug | Freq: Once | INTRAMUSCULAR | Status: DC
  Filled 2019-03-16: qty 2

## 2019-03-16 NOTE — ED Triage Notes (Signed)
Pt here with SOB and chest pain since last night. Pain is located central chest and radiates to back. Feels tight and achy

## 2019-03-16 NOTE — ED Provider Notes (Signed)
[facility] EMERGENCY DEPARTMENT Provider Note   CSN: [id number] Arrival date & time: 03/16/19  1729     History Chief Complaint  Patient presents with  . Chest Pain  . Shortness of Breath    [name] is a 49 y.o. female.  The history is provided by the patient.  Shortness of Breath Severity:  Mild Onset quality:  Gradual Timing:  Constant Progression:  Unchanged Chronicity:  New Context: URI   Relieved by:  Nothing Worsened by:  Nothing Associated symptoms: chest pain and cough   Associated symptoms: no abdominal pain, no claudication, no ear pain, no fever, no rash, no sore throat, no syncope and no vomiting   Risk factors: no hx of PE/DVT        Past Medical History:  Diagnosis Date  . Anemia   . Chest pain   . Costal chondritis   . Costochondritis    Acute  . Costochondritis   . Hx of myomectomy 09-30-2008   sr did the surgery  . Oth tear of unsp mensc, current injury, unsp knee, sequela   . Swine flu 2009  . Uterine fibroid     Patient Active Problem List   Diagnosis Date Noted  . Left knee pain 05/28/2016  . Fibroids 09/08/2015  . 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 Seasonal allergies 06/10/2012  . Nocturia 12/01/2011  . Uterine fibroid   . Swine flu   . Costochondritis 05/03/2011  . Hx of myomectomy 09/30/2008    Past Surgical History:  Procedure Laterality Date  . CERVICAL BIOPSY    . MYOMECTOMY     sr did surgery  . MYOMECTOMY N/A 09/08/2015   Procedure: ABDOMINAL MYOMECTOMY;  Surgeon: Delsa Bern, MD;  Location: [facility];  Service: Gynecology;  Laterality: N/A;  . UTERINE ARTERY EMBOLIZATION       OB History    Gravida  1   Para  0   Term      Preterm      AB  1   Living  0     SAB  1   TAB      Ectopic      Multiple      Live Births              Family History  Problem Relation Age of Onset  . Hypertension Mother   . Hypertension Brother   . Diabetes Maternal Grandmother    . Diabetes Maternal Aunt   . Diabetes Maternal Aunt   . Diabetes Maternal Aunt     Social History   Tobacco Use  . Smoking status: Never Smoker  . Smokeless tobacco: Never Used  Substance Use Topics  . Alcohol use: No  . Drug use: No    Home Medications Prior to Admission medications   Medication Sig Start Date End Date Taking? Authorizing Provider  albuterol (PROVENTIL HFA;VENTOLIN HFA) 108 (90 Base) MCG/ACT inhaler Inhale 1-2 puffs into the lungs every 6 (six) hours as needed for wheezing or shortness of breath. 02/25/18   Darlyne Russian, MD  azithromycin (ZITHROMAX Z-PAK) 250 MG tablet Take 2 tablets on day one, then 1 tablet daily on days 2 through 5 03/01/18   Jacqulyn Cane, MD  AZURETTE 0.15-0.02/0.01 MG (21/5) tablet TK 1 T PO QD 05/02/16   [provider]  Clobetasol Propionate 0.05 % shampoo APP TOPICALLY D FOR 28 DAYS 08/14/16   [provider]  desogestrel-ethinyl estradiol (APRI,EMOQUETTE,SOLIA) 0.15-30  MG-MCG tablet Take 1 tablet by mouth daily.    [provider]  doxycycline (VIBRAMYCIN) 100 MG capsule Take 1 capsule (100 mg total) by mouth 2 (two) times daily. 02/25/18   Darlyne Russian, MD  FeAsp-B12-FA-C-DSS-SuccAc-Zn (FERIVA 21/7) 75-1 MG TABS Take 1 tablet by mouth every morning. Patient not taking: Reported on 09/17/2015 09/09/15   Delsa Bern, MD  fluticasone Asencion Islam) 50 MCG/ACT nasal spray  08/22/16   [provider]  GYNAZOLE-1 2 % CREA  07/29/16   [provider]  ibuprofen (ADVIL,MOTRIN) 600 MG tablet Take 1 tablet (600 mg total) by mouth 3 (three) times daily. 02/25/18   Darlyne Russian, MD  ipratropium (ATROVENT) 0.06 % nasal spray  08/22/16   [provider]  levocetirizine (XYZAL) 5 MG tablet Take 5 mg by mouth daily as needed for allergies.    [provider]    Allergies    Hydrocodone and Pollen extract  Review of Systems   Review of Systems  Constitutional: Negative for chills and fever.  HENT:  Negative for ear pain and sore throat.   Eyes: Negative for pain and visual disturbance.  Respiratory: Positive for cough and shortness of breath.   Cardiovascular: Positive for chest pain. Negative for palpitations, claudication and syncope.  Gastrointestinal: Negative for abdominal pain and vomiting.  Genitourinary: Negative for dysuria and hematuria.  Musculoskeletal: Negative for arthralgias and back pain.  Skin: Negative for color change and rash.  Neurological: Negative for seizures and syncope.  All other systems reviewed and are negative.   Physical Exam Updated Vital Signs  ED Triage Vitals [03/16/19 1740]  Enc Vitals Group     BP (!) 181/101     Pulse Rate 78     Resp (!) 26     Temp 98.9 F (37.2 C)     Temp Source Oral     SpO2 100 %     Weight      Height      Head Circumference      Peak Flow      Pain Score 10     Pain Loc      Pain Edu?      Excl. in [place]?     Physical Exam Vitals and nursing note reviewed.  Constitutional:      General: She is not in acute distress.    Appearance: She is well-developed.  HENT:     Head: Normocephalic and atraumatic.  Eyes:     Conjunctiva/sclera: Conjunctivae normal.     Pupils: Pupils are equal, round, and reactive to light.  Cardiovascular:     Rate and Rhythm: Normal rate and regular rhythm.     Pulses:          Radial pulses are 2+ on the right side and 2+ on the left side.     Heart sounds: Normal heart sounds. No murmur.  Pulmonary:     Effort: Pulmonary effort is normal. No respiratory distress.     Breath sounds: Normal breath sounds. No decreased breath sounds, wheezing, rhonchi or rales.  Abdominal:     Palpations: Abdomen is soft.     Tenderness: There is no abdominal tenderness.  Musculoskeletal:     Cervical back: Normal range of motion and neck supple.  Skin:    General: Skin is warm and dry.     Capillary Refill: Capillary refill takes less than 2 seconds.  Neurological:     General: No focal  deficit present.  Mental Status: She is alert.     ED Results / Procedures / Treatments   Labs (all labs ordered are listed, but only abnormal results are displayed) Labs Reviewed  CBC WITH DIFFERENTIAL/PLATELET - Abnormal; Notable for the following components:      Result Value   WBC 15.8 (*)    Neutro Abs 13.1 (*)    Abs Immature Granulocytes 0.18 (*)    All other components within normal limits  BASIC METABOLIC PANEL - Abnormal; Notable for the following components:   Sodium 132 (*)    CO2 20 (*)    Glucose, Bld 119 (*)    All other components within normal limits  NOVEL CORONAVIRUS, NAA (HOSP ORDER, SEND-OUT TO REF LAB; TAT 18-24 HRS)  D-DIMER, QUANTITATIVE (NOT AT [facility])  TROPONIN I (HIGH SENSITIVITY)  TROPONIN I (HIGH SENSITIVITY)    EKG EKG Interpretation  Date/Time:  Sunday March 16 2019 17:40:11 EST Ventricular Rate:  78 PR Interval:    QRS Duration: 86 QT Interval:  356 QTC Calculation: 406 R Axis:   16 Text Interpretation: Sinus rhythm Consider right atrial enlargement Confirmed by Lennice Sites [phone]) on 03/16/2019 5:50:39 PM Also confirmed by Lennice Sites [phone])  on 03/16/2019 6:29:17 PM   Radiology CT Angio Chest PE W and/or Wo Contrast  Result Date: 03/16/2019 CLINICAL DATA:  49 year old female with history of shortness of breath and chest pain since yesterday evening. EXAM: CT ANGIOGRAPHY CHEST WITH CONTRAST TECHNIQUE: Multidetector CT imaging of the chest was performed using the standard protocol during bolus administration of intravenous contrast. Multiplanar CT image reconstructions and MIPs were obtained to evaluate the vascular anatomy. CONTRAST:  20mL OMNIPAQUE IOHEXOL 350 MG/ML SOLN COMPARISON:  No priors. FINDINGS: Comment: Today's study is limited by respiratory motion. Cardiovascular: No filling defects in the central, lobar or segmental sized pulmonary arterial tree to suggest underlying pulmonary embolism. Smaller subsegmental sized  pulmonary embolism cannot be completely excluded secondary to respiratory motion. Heart size is normal. There is no significant pericardial fluid, thickening or pericardial calcification. No atherosclerotic calcifications in the thoracic aorta or the coronary arteries. Mediastinum/Nodes: No pathologically enlarged mediastinal or hilar lymph nodes. Esophagus is unremarkable in appearance. No axillary lymphadenopathy. Lungs/Pleura: No acute consolidative airspace disease. No pleural effusions. No suspicious appearing pulmonary nodules or masses are noted. Upper Abdomen: Unremarkable. Musculoskeletal: 8 mm sclerotic lesion with narrow zone of transition in the T6 vertebral body, favored to represent a small bone island. There are no other aggressive appearing lytic or blastic lesions noted in the visualized portions of the skeleton. Review of the MIP images confirms the above findings. IMPRESSION: 1. No evidence of central, lobar or segmental sized pulmonary embolism. 2. No acute findings are noted in the thorax to account for the patient's symptoms. Electronically Signed   By: [name] M.D.   On: 03/16/2019 20:01   DG Chest Portable 1 View  Result Date: 03/16/2019 CLINICAL DATA:  49 year old female with shortness of breath. EXAM: PORTABLE CHEST 1 VIEW COMPARISON:  Chest radiograph dated 12/13/2018. FINDINGS: The heart size and mediastinal contours are within normal limits. Both lungs are clear. The visualized skeletal structures are unremarkable. IMPRESSION: No active disease. Electronically Signed   By: Anner Crete M.D.   On: 03/16/2019 18:22    Procedures Procedures (including critical care time)  Medications Ordered in ED Medications  fentaNYL (SUBLIMAZE) injection 50 mcg (50 mcg Intravenous Refused 03/16/19 1901)  iohexol (OMNIPAQUE) 350 MG/ML injection 75 mL (77 mLs Intravenous Contrast Given 03/16/19  [email or web address])    ED Course  I have reviewed the triage vital signs and the nursing  notes.  Pertinent labs & imaging results that were available during my care of the patient were reviewed by me and considered in my medical decision making (see chart for details).    MDM Rules/Calculators/A&P                      Trenisha R [place] is a 49 year old female with history of costochondritis who presents to the ED with chest pain, shortness of breath.  Symptoms since last night.  Currently on steroids for inflammation but she is not sure specifically.  Patient has some mild hypertension but otherwise unremarkable vitals.  She overall appears comfortable.  Has clear breath sounds.  No reproducible chest pain on exam.  She states that she does feel achy.  Concern for possible infection.  Does not have any PE risk factors.  No cardiac risk factors.  Atypical sounding story for dissection or ACS.  However will evaluate with lab work including troponin, D-dimer.  EKG shows sinus rhythm.  No ischemic changes.  D-dimer normal.  However, given her work of breathing will get a CT scan to rule out PE further and pneumonia.  Chest x-ray was clear of infection, pneumothorax, pleural effusion.  Troponin within normal limits.  No other significant anemia, electrolyte abnormality, kidney injury.  Prednisone likely causing leukocytosis.  She appears to be undergoing work-up for possible RA or other rheumatological process.  Therefore believe CT scan of chest will give further indication to see if there is any signs of inflammation or infection or PE.  Troponin unremarkable.  CT scan of chest showed no acute PE or infectious findings.  Overall possibly side effect of prednisone.  No signs of volume overload on exam.  We will have her follow-up closely with her primary care doctor.  Will test for coronavirus.  Patient understands return precautions.  This chart was dictated using voice recognition software.  Despite best efforts to proofread,  errors can occur which can change the documentation meaning.     Final Clinical Impression(s) / ED Diagnoses Final diagnoses:  SOB (shortness of breath)    Rx / DC Orders ED Discharge Orders    None       Lennice Sites, DO 03/16/19 2144

## 2019-03-16 NOTE — Discharge Instructions (Signed)
Follow-up with your primary care doctor early this week for reevaluation.  Please return to emergency department if symptoms worsen.

## 2019-03-18 DIAGNOSIS — R0789 Other chest pain: Secondary | ICD-10-CM | POA: Diagnosis not present

## 2019-03-18 DIAGNOSIS — K219 Gastro-esophageal reflux disease without esophagitis: Secondary | ICD-10-CM | POA: Diagnosis not present

## 2019-03-18 LAB — NOVEL CORONAVIRUS, NAA (HOSP ORDER, SEND-OUT TO REF LAB; TAT 18-24 HRS): SARS-CoV-2, NAA: NOT DETECTED

## 2019-04-24 DIAGNOSIS — F4323 Adjustment disorder with mixed anxiety and depressed mood: Secondary | ICD-10-CM | POA: Diagnosis not present

## 2019-04-29 DIAGNOSIS — F4323 Adjustment disorder with mixed anxiety and depressed mood: Secondary | ICD-10-CM | POA: Diagnosis not present

## 2019-05-09 DIAGNOSIS — M25562 Pain in left knee: Secondary | ICD-10-CM | POA: Diagnosis not present

## 2019-05-09 DIAGNOSIS — M5416 Radiculopathy, lumbar region: Secondary | ICD-10-CM | POA: Diagnosis not present

## 2019-05-09 DIAGNOSIS — Z6841 Body Mass Index (BMI) 40.0 and over, adult: Secondary | ICD-10-CM | POA: Diagnosis not present

## 2019-05-12 DIAGNOSIS — M791 Myalgia, unspecified site: Secondary | ICD-10-CM | POA: Diagnosis not present

## 2019-05-12 DIAGNOSIS — M545 Low back pain: Secondary | ICD-10-CM | POA: Diagnosis not present

## 2019-05-16 ENCOUNTER — Ambulatory Visit
Admission: RE | Admit: 2019-05-16 | Discharge: 2019-05-16 | Disposition: A | Discharge status: Home / Self Care | Payer: BC Managed Care – PPO | Source: Ambulatory Visit | Attending: Physician Assistant | Admitting: Physician Assistant

## 2019-05-16 ENCOUNTER — Other Ambulatory Visit: Payer: Self-pay | Admitting: Physician Assistant

## 2019-05-16 ENCOUNTER — Other Ambulatory Visit: Payer: Self-pay

## 2019-05-16 DIAGNOSIS — M546 Pain in thoracic spine: Secondary | ICD-10-CM

## 2019-05-16 DIAGNOSIS — M47816 Spondylosis without myelopathy or radiculopathy, lumbar region: Secondary | ICD-10-CM | POA: Diagnosis not present

## 2019-05-16 DIAGNOSIS — M542 Cervicalgia: Secondary | ICD-10-CM

## 2019-05-16 DIAGNOSIS — M545 Low back pain, unspecified: Secondary | ICD-10-CM

## 2019-05-16 DIAGNOSIS — M79606 Pain in leg, unspecified: Secondary | ICD-10-CM

## 2019-05-16 DIAGNOSIS — M47814 Spondylosis without myelopathy or radiculopathy, thoracic region: Secondary | ICD-10-CM | POA: Diagnosis not present

## 2019-05-21 ENCOUNTER — Ambulatory Visit: Payer: BC Managed Care – PPO | Admitting: Family Medicine

## 2019-05-21 NOTE — Progress Notes (Deleted)
  [name] - 49 y.o. female MRN [id number]  Date of birth: Dec 18, 1970  SUBJECTIVE:  Including CC & ROS.  No chief complaint on file.   [name] is a 49 y.o. female that is  ***.  ***   Review of Systems See HPI   HISTORY: Past Medical, Surgical, Social, and Family History Reviewed & Updated per EMR.   Pertinent Historical Findings include:  Past Medical History:  Diagnosis Date  . Anemia   . Chest pain   . Costal chondritis   . Costochondritis    Acute  . Costochondritis   . Hx of myomectomy 09-30-2008   sr did the surgery  . Oth tear of unsp mensc, current injury, unsp knee, sequela   . Swine flu 2009  . Uterine fibroid     Past Surgical History:  Procedure Laterality Date  . CERVICAL BIOPSY    . MYOMECTOMY     sr did surgery  . MYOMECTOMY N/A 09/08/2015   Procedure: ABDOMINAL MYOMECTOMY;  Surgeon: Delsa Bern, MD;  Location: [facility];  Service: Gynecology;  Laterality: N/A;  . UTERINE ARTERY EMBOLIZATION      Family History  Problem Relation Age of Onset  . Hypertension Mother   . Hypertension Brother   . Diabetes Maternal Grandmother   . Diabetes Maternal Aunt   . Diabetes Maternal Aunt   . Diabetes Maternal Aunt     Social History   Socioeconomic History  . Marital status: Divorced    Spouse name: Not on file  . Number of children: 0  . Years of education: Not on file  . Highest education level: Not on file  Occupational History  . Occupation: department of social services    Employer: [employer]  Tobacco Use  . Smoking status: Never Smoker  . Smokeless tobacco: Never Used  Substance and Sexual Activity  . Alcohol use: No  . Drug use: No  . Sexual activity: Never    Partners: Male    Birth control/protection: None  Other Topics Concern  . Not on file  Social History Narrative  . Not on file   Social Determinants of Health   Financial Resource Strain:   . Difficulty of Paying Living Expenses:   Food Insecurity:   .  Worried About Charity fundraiser in the Last Year:   . Arboriculturist in the Last Year:   Transportation Needs:   . Film/video editor (Medical):   Marland Kitchen Lack of Transportation (Non-Medical):   Physical Activity:   . Days of Exercise per Week:   . Minutes of Exercise per Session:   Stress:   . Feeling of Stress :   Social Connections:   . Frequency of Communication with Friends and Family:   . Frequency of Social Gatherings with Friends and Family:   . Attends Religious Services:   . Active Member of Clubs or Organizations:   . Attends Archivist Meetings:   Marland Kitchen Marital Status:   Intimate Partner Violence:   . Fear of Current or Ex-Partner:   . Emotionally Abused:   Marland Kitchen Physically Abused:   . Sexually Abused:      PHYSICAL EXAM:  VS: There were no vitals taken for this visit. Physical Exam Gen: NAD, alert, cooperative with exam, well-appearing MSK:  ***      ASSESSMENT & PLAN:   No problem-specific Assessment & Plan notes found for this encounter.

## 2019-05-23 DIAGNOSIS — Z6841 Body Mass Index (BMI) 40.0 and over, adult: Secondary | ICD-10-CM | POA: Diagnosis not present

## 2019-05-23 DIAGNOSIS — M545 Low back pain: Secondary | ICD-10-CM | POA: Diagnosis not present

## 2019-05-29 NOTE — Progress Notes (Deleted)
  TERESE VI - 49 y.o. female MRN [id number]  Date of birth: [birth year]  SUBJECTIVE:  Including CC & ROS.  No chief complaint on file.   MIRCLE FEDRICK is a 49 y.o. female that is  ***.  ***   Review of Systems See HPI   HISTORY: Past Medical, Surgical, Social, and Family History Reviewed & Updated per EMR.   Pertinent Historical Findings include:  Past Medical History:  Diagnosis Date  . Anemia   . Chest pain   . Costal chondritis   . Costochondritis    Acute  . Costochondritis   . Hx of myomectomy 09-30-2008   sr did the surgery  . Oth tear of unsp mensc, current injury, unsp knee, sequela   . Swine flu 2009  . Uterine fibroid     Past Surgical History:  Procedure Laterality Date  . CERVICAL BIOPSY    . MYOMECTOMY     sr did surgery  . MYOMECTOMY N/A 09/08/2015   Procedure: ABDOMINAL MYOMECTOMY;  Surgeon: Delsa Bern, MD;  Location: [facility];  Service: Gynecology;  Laterality: N/A;  . UTERINE ARTERY EMBOLIZATION      Family History  Problem Relation Age of Onset  . Hypertension Mother   . Hypertension Brother   . Diabetes Maternal Grandmother   . Diabetes Maternal Aunt   . Diabetes Maternal Aunt   . Diabetes Maternal Aunt     Social History   Socioeconomic History  . Marital status: Divorced    Spouse name: Not on file  . Number of children: 0  . Years of education: Not on file  . Highest education level: Not on file  Occupational History  . Occupation: department of social services    Employer: [employer]  Tobacco Use  . Smoking status: Never Smoker  . Smokeless tobacco: Never Used  Substance and Sexual Activity  . Alcohol use: No  . Drug use: No  . Sexual activity: Never    Partners: Male    Birth control/protection: None  Other Topics Concern  . Not on file  Social History Narrative  . Not on file   Social Determinants of Health   Financial Resource Strain:   . Difficulty of Paying Living Expenses:   Food Insecurity:   .  Worried About Charity fundraiser in the Last Year:   . Arboriculturist in the Last Year:   Transportation Needs:   . Film/video editor (Medical):   Marland Kitchen Lack of Transportation (Non-Medical):   Physical Activity:   . Days of Exercise per Week:   . Minutes of Exercise per Session:   Stress:   . Feeling of Stress :   Social Connections:   . Frequency of Communication with Friends and Family:   . Frequency of Social Gatherings with Friends and Family:   . Attends Religious Services:   . Active Member of Clubs or Organizations:   . Attends Archivist Meetings:   Marland Kitchen Marital Status:   Intimate Partner Violence:   . Fear of Current or Ex-Partner:   . Emotionally Abused:   Marland Kitchen Physically Abused:   . Sexually Abused:      PHYSICAL EXAM:  VS: There were no vitals taken for this visit. Physical Exam Gen: NAD, alert, cooperative with exam, well-appearing MSK:  ***      ASSESSMENT & PLAN:   No problem-specific Assessment & Plan notes found for this encounter.

## 2019-05-30 ENCOUNTER — Ambulatory Visit: Payer: BC Managed Care – PPO | Admitting: Family Medicine

## 2019-06-02 DIAGNOSIS — M545 Low back pain: Secondary | ICD-10-CM | POA: Diagnosis not present

## 2019-06-02 DIAGNOSIS — M542 Cervicalgia: Secondary | ICD-10-CM | POA: Diagnosis not present

## 2019-06-13 DIAGNOSIS — M542 Cervicalgia: Secondary | ICD-10-CM | POA: Diagnosis not present

## 2019-06-13 DIAGNOSIS — M545 Low back pain: Secondary | ICD-10-CM | POA: Diagnosis not present

## 2019-06-16 DIAGNOSIS — Z1231 Encounter for screening mammogram for malignant neoplasm of breast: Secondary | ICD-10-CM | POA: Diagnosis not present

## 2019-06-16 DIAGNOSIS — Z6841 Body Mass Index (BMI) 40.0 and over, adult: Secondary | ICD-10-CM | POA: Diagnosis not present

## 2019-06-16 DIAGNOSIS — Z01419 Encounter for gynecological examination (general) (routine) without abnormal findings: Secondary | ICD-10-CM | POA: Diagnosis not present

## 2019-06-16 DIAGNOSIS — N760 Acute vaginitis: Secondary | ICD-10-CM | POA: Diagnosis not present

## 2019-06-16 DIAGNOSIS — Z3040 Encounter for surveillance of contraceptives, unspecified: Secondary | ICD-10-CM | POA: Diagnosis not present

## 2019-06-27 DIAGNOSIS — Z0000 Encounter for general adult medical examination without abnormal findings: Secondary | ICD-10-CM | POA: Diagnosis not present

## 2019-06-27 DIAGNOSIS — M545 Low back pain: Secondary | ICD-10-CM | POA: Diagnosis not present

## 2019-06-27 DIAGNOSIS — Z23 Encounter for immunization: Secondary | ICD-10-CM | POA: Diagnosis not present

## 2019-06-27 DIAGNOSIS — M542 Cervicalgia: Secondary | ICD-10-CM | POA: Diagnosis not present

## 2019-07-04 DIAGNOSIS — M5030 Other cervical disc degeneration, unspecified cervical region: Secondary | ICD-10-CM | POA: Diagnosis not present

## 2019-07-04 DIAGNOSIS — M5136 Other intervertebral disc degeneration, lumbar region: Secondary | ICD-10-CM | POA: Diagnosis not present

## 2019-09-02 ENCOUNTER — Ambulatory Visit: Payer: BC Managed Care – PPO | Admitting: Neurology

## 2019-09-02 ENCOUNTER — Encounter: Payer: Self-pay | Admitting: Neurology

## 2019-11-03 ENCOUNTER — Ambulatory Visit: Payer: Managed Care, Other (non HMO) | Admitting: Diagnostic Neuroimaging

## 2019-11-04 ENCOUNTER — Telehealth: Payer: Self-pay | Admitting: *Deleted

## 2019-11-04 ENCOUNTER — Ambulatory Visit: Payer: Managed Care, Other (non HMO) | Admitting: Neurology

## 2019-11-04 ENCOUNTER — Encounter: Payer: Self-pay | Admitting: Neurology

## 2019-11-04 NOTE — Telephone Encounter (Signed)
No showed new patient appointment. 

## 2020-09-29 ENCOUNTER — Ambulatory Visit: Payer: Managed Care, Other (non HMO) | Attending: Nurse Practitioner | Admitting: Physical Therapy

## 2021-06-12 DIAGNOSIS — N390 Urinary tract infection, site not specified: Secondary | ICD-10-CM | POA: Diagnosis not present

## 2021-06-12 DIAGNOSIS — H00019 Hordeolum externum unspecified eye, unspecified eyelid: Secondary | ICD-10-CM | POA: Diagnosis not present

## 2021-06-12 DIAGNOSIS — H00015 Hordeolum externum left lower eyelid: Secondary | ICD-10-CM | POA: Diagnosis not present

## 2021-06-19 DIAGNOSIS — R3989 Other symptoms and signs involving the genitourinary system: Secondary | ICD-10-CM | POA: Diagnosis not present

## 2021-06-19 DIAGNOSIS — R300 Dysuria: Secondary | ICD-10-CM | POA: Diagnosis not present

## 2021-06-23 DIAGNOSIS — H00015 Hordeolum externum left lower eyelid: Secondary | ICD-10-CM | POA: Diagnosis not present

## 2021-06-30 DIAGNOSIS — J0100 Acute maxillary sinusitis, unspecified: Secondary | ICD-10-CM | POA: Diagnosis not present

## 2021-07-13 DIAGNOSIS — Z1239 Encounter for other screening for malignant neoplasm of breast: Secondary | ICD-10-CM | POA: Diagnosis not present

## 2021-07-13 DIAGNOSIS — Z1211 Encounter for screening for malignant neoplasm of colon: Secondary | ICD-10-CM | POA: Diagnosis not present

## 2021-07-13 DIAGNOSIS — Z124 Encounter for screening for malignant neoplasm of cervix: Secondary | ICD-10-CM | POA: Diagnosis not present

## 2021-07-13 DIAGNOSIS — R350 Frequency of micturition: Secondary | ICD-10-CM | POA: Diagnosis not present

## 2021-07-13 DIAGNOSIS — Z01419 Encounter for gynecological examination (general) (routine) without abnormal findings: Secondary | ICD-10-CM | POA: Diagnosis not present

## 2021-07-27 DIAGNOSIS — M79644 Pain in right finger(s): Secondary | ICD-10-CM | POA: Diagnosis not present

## 2021-07-27 DIAGNOSIS — M25562 Pain in left knee: Secondary | ICD-10-CM | POA: Diagnosis not present

## 2021-07-27 DIAGNOSIS — M79671 Pain in right foot: Secondary | ICD-10-CM | POA: Diagnosis not present

## 2021-08-01 DIAGNOSIS — N952 Postmenopausal atrophic vaginitis: Secondary | ICD-10-CM | POA: Diagnosis not present

## 2021-08-01 DIAGNOSIS — N760 Acute vaginitis: Secondary | ICD-10-CM | POA: Diagnosis not present

## 2021-08-23 ENCOUNTER — Encounter: Payer: Managed Care, Other (non HMO) | Admitting: Family Medicine

## 2021-09-01 DIAGNOSIS — D23111 Other benign neoplasm of skin of right upper eyelid, including canthus: Secondary | ICD-10-CM | POA: Diagnosis not present

## 2021-09-01 DIAGNOSIS — D485 Neoplasm of uncertain behavior of skin: Secondary | ICD-10-CM | POA: Diagnosis not present

## 2021-10-16 DIAGNOSIS — Z3202 Encounter for pregnancy test, result negative: Secondary | ICD-10-CM | POA: Diagnosis not present

## 2021-10-16 DIAGNOSIS — N390 Urinary tract infection, site not specified: Secondary | ICD-10-CM | POA: Diagnosis not present

## 2021-11-15 DIAGNOSIS — R948 Abnormal results of function studies of other organs and systems: Secondary | ICD-10-CM | POA: Diagnosis not present

## 2021-11-15 DIAGNOSIS — R635 Abnormal weight gain: Secondary | ICD-10-CM | POA: Diagnosis not present

## 2021-11-30 DIAGNOSIS — R948 Abnormal results of function studies of other organs and systems: Secondary | ICD-10-CM | POA: Diagnosis not present

## 2021-11-30 DIAGNOSIS — Z6841 Body Mass Index (BMI) 40.0 and over, adult: Secondary | ICD-10-CM | POA: Diagnosis not present

## 2021-11-30 DIAGNOSIS — R7303 Prediabetes: Secondary | ICD-10-CM | POA: Diagnosis not present

## 2021-12-05 DIAGNOSIS — R030 Elevated blood-pressure reading, without diagnosis of hypertension: Secondary | ICD-10-CM | POA: Diagnosis not present

## 2021-12-05 DIAGNOSIS — Z0000 Encounter for general adult medical examination without abnormal findings: Secondary | ICD-10-CM | POA: Diagnosis not present

## 2021-12-05 DIAGNOSIS — M5136 Other intervertebral disc degeneration, lumbar region: Secondary | ICD-10-CM | POA: Diagnosis not present

## 2021-12-05 DIAGNOSIS — R7303 Prediabetes: Secondary | ICD-10-CM | POA: Diagnosis not present

## 2021-12-26 DIAGNOSIS — M722 Plantar fascial fibromatosis: Secondary | ICD-10-CM | POA: Diagnosis not present

## 2021-12-26 DIAGNOSIS — D1724 Benign lipomatous neoplasm of skin and subcutaneous tissue of left leg: Secondary | ICD-10-CM | POA: Diagnosis not present

## 2021-12-26 DIAGNOSIS — D1723 Benign lipomatous neoplasm of skin and subcutaneous tissue of right leg: Secondary | ICD-10-CM | POA: Diagnosis not present

## 2022-01-03 DIAGNOSIS — J019 Acute sinusitis, unspecified: Secondary | ICD-10-CM | POA: Diagnosis not present

## 2022-03-02 ENCOUNTER — Ambulatory Visit
Admission: RE | Admit: 2022-03-02 | Discharge: 2022-03-02 | Disposition: A | Discharge status: Home / Self Care | Payer: BC Managed Care – PPO | Source: Ambulatory Visit | Attending: Family Medicine | Admitting: Family Medicine

## 2022-03-02 ENCOUNTER — Other Ambulatory Visit: Payer: Self-pay | Admitting: Family Medicine

## 2022-03-02 DIAGNOSIS — E559 Vitamin D deficiency, unspecified: Secondary | ICD-10-CM | POA: Diagnosis not present

## 2022-03-02 DIAGNOSIS — M1712 Unilateral primary osteoarthritis, left knee: Secondary | ICD-10-CM | POA: Diagnosis not present

## 2022-03-02 DIAGNOSIS — R208 Other disturbances of skin sensation: Secondary | ICD-10-CM | POA: Diagnosis not present

## 2022-03-02 DIAGNOSIS — M79604 Pain in right leg: Secondary | ICD-10-CM | POA: Diagnosis not present

## 2022-03-02 DIAGNOSIS — R350 Frequency of micturition: Secondary | ICD-10-CM | POA: Diagnosis not present

## 2022-03-02 DIAGNOSIS — N952 Postmenopausal atrophic vaginitis: Secondary | ICD-10-CM | POA: Diagnosis not present

## 2022-03-02 DIAGNOSIS — N951 Menopausal and female climacteric states: Secondary | ICD-10-CM | POA: Diagnosis not present

## 2022-03-02 DIAGNOSIS — M25562 Pain in left knee: Secondary | ICD-10-CM | POA: Diagnosis not present

## 2022-03-02 DIAGNOSIS — M79605 Pain in left leg: Secondary | ICD-10-CM | POA: Diagnosis not present

## 2022-03-02 DIAGNOSIS — M79601 Pain in right arm: Secondary | ICD-10-CM | POA: Diagnosis not present

## 2022-03-06 DIAGNOSIS — R208 Other disturbances of skin sensation: Secondary | ICD-10-CM | POA: Diagnosis not present

## 2022-03-06 DIAGNOSIS — R5383 Other fatigue: Secondary | ICD-10-CM | POA: Diagnosis not present

## 2022-03-06 DIAGNOSIS — E559 Vitamin D deficiency, unspecified: Secondary | ICD-10-CM | POA: Diagnosis not present

## 2022-03-13 DIAGNOSIS — M50322 Other cervical disc degeneration at C5-C6 level: Secondary | ICD-10-CM | POA: Diagnosis not present

## 2022-03-13 DIAGNOSIS — M40204 Unspecified kyphosis, thoracic region: Secondary | ICD-10-CM | POA: Diagnosis not present

## 2022-03-13 DIAGNOSIS — W108XXD Fall (on) (from) other stairs and steps, subsequent encounter: Secondary | ICD-10-CM | POA: Diagnosis not present

## 2022-03-13 DIAGNOSIS — M549 Dorsalgia, unspecified: Secondary | ICD-10-CM | POA: Diagnosis not present

## 2022-03-13 DIAGNOSIS — M546 Pain in thoracic spine: Secondary | ICD-10-CM | POA: Diagnosis not present

## 2022-03-13 DIAGNOSIS — M50323 Other cervical disc degeneration at C6-C7 level: Secondary | ICD-10-CM | POA: Diagnosis not present

## 2022-03-19 DIAGNOSIS — R21 Rash and other nonspecific skin eruption: Secondary | ICD-10-CM | POA: Diagnosis not present

## 2022-04-03 DIAGNOSIS — M25562 Pain in left knee: Secondary | ICD-10-CM | POA: Diagnosis not present

## 2022-04-03 DIAGNOSIS — M25512 Pain in left shoulder: Secondary | ICD-10-CM | POA: Diagnosis not present

## 2022-04-27 DIAGNOSIS — M25511 Pain in right shoulder: Secondary | ICD-10-CM | POA: Diagnosis not present

## 2022-04-27 DIAGNOSIS — M25512 Pain in left shoulder: Secondary | ICD-10-CM | POA: Diagnosis not present

## 2022-04-28 DIAGNOSIS — N898 Other specified noninflammatory disorders of vagina: Secondary | ICD-10-CM | POA: Diagnosis not present

## 2022-04-28 DIAGNOSIS — N951 Menopausal and female climacteric states: Secondary | ICD-10-CM | POA: Diagnosis not present

## 2022-04-28 DIAGNOSIS — E559 Vitamin D deficiency, unspecified: Secondary | ICD-10-CM | POA: Diagnosis not present

## 2022-04-28 DIAGNOSIS — R030 Elevated blood-pressure reading, without diagnosis of hypertension: Secondary | ICD-10-CM | POA: Diagnosis not present

## 2022-05-08 ENCOUNTER — Encounter: Payer: Self-pay | Admitting: *Deleted

## 2022-05-08 DIAGNOSIS — M25512 Pain in left shoulder: Secondary | ICD-10-CM | POA: Diagnosis not present

## 2022-05-08 DIAGNOSIS — M25511 Pain in right shoulder: Secondary | ICD-10-CM | POA: Diagnosis not present

## 2022-05-18 DIAGNOSIS — R7303 Prediabetes: Secondary | ICD-10-CM | POA: Diagnosis not present

## 2022-05-18 DIAGNOSIS — R030 Elevated blood-pressure reading, without diagnosis of hypertension: Secondary | ICD-10-CM | POA: Diagnosis not present

## 2022-05-18 DIAGNOSIS — Z13220 Encounter for screening for lipoid disorders: Secondary | ICD-10-CM | POA: Diagnosis not present

## 2022-05-18 DIAGNOSIS — D508 Other iron deficiency anemias: Secondary | ICD-10-CM | POA: Diagnosis not present

## 2022-05-26 DIAGNOSIS — M25511 Pain in right shoulder: Secondary | ICD-10-CM | POA: Diagnosis not present

## 2022-05-26 DIAGNOSIS — M25512 Pain in left shoulder: Secondary | ICD-10-CM | POA: Diagnosis not present

## 2022-07-18 DIAGNOSIS — M7989 Other specified soft tissue disorders: Secondary | ICD-10-CM | POA: Diagnosis not present

## 2022-07-24 DIAGNOSIS — I10 Essential (primary) hypertension: Secondary | ICD-10-CM | POA: Diagnosis not present

## 2022-07-24 DIAGNOSIS — K219 Gastro-esophageal reflux disease without esophagitis: Secondary | ICD-10-CM | POA: Diagnosis not present

## 2022-07-24 DIAGNOSIS — R351 Nocturia: Secondary | ICD-10-CM | POA: Diagnosis not present

## 2022-07-24 DIAGNOSIS — R079 Chest pain, unspecified: Secondary | ICD-10-CM | POA: Diagnosis not present

## 2022-07-25 DIAGNOSIS — M25522 Pain in left elbow: Secondary | ICD-10-CM | POA: Diagnosis not present

## 2022-07-25 DIAGNOSIS — M25562 Pain in left knee: Secondary | ICD-10-CM | POA: Diagnosis not present

## 2022-07-25 DIAGNOSIS — Z043 Encounter for examination and observation following other accident: Secondary | ICD-10-CM | POA: Diagnosis not present

## 2022-07-25 DIAGNOSIS — R296 Repeated falls: Secondary | ICD-10-CM | POA: Diagnosis not present

## 2022-07-25 DIAGNOSIS — W19XXXA Unspecified fall, initial encounter: Secondary | ICD-10-CM | POA: Diagnosis not present

## 2022-08-10 DIAGNOSIS — R350 Frequency of micturition: Secondary | ICD-10-CM | POA: Diagnosis not present

## 2022-08-10 DIAGNOSIS — Z1231 Encounter for screening mammogram for malignant neoplasm of breast: Secondary | ICD-10-CM | POA: Diagnosis not present

## 2022-08-10 DIAGNOSIS — Z139 Encounter for screening, unspecified: Secondary | ICD-10-CM | POA: Diagnosis not present

## 2022-08-10 DIAGNOSIS — Z01419 Encounter for gynecological examination (general) (routine) without abnormal findings: Secondary | ICD-10-CM | POA: Diagnosis not present

## 2022-08-10 DIAGNOSIS — M542 Cervicalgia: Secondary | ICD-10-CM | POA: Diagnosis not present

## 2022-08-14 DIAGNOSIS — J019 Acute sinusitis, unspecified: Secondary | ICD-10-CM | POA: Diagnosis not present

## 2022-08-17 DIAGNOSIS — U071 COVID-19: Secondary | ICD-10-CM | POA: Diagnosis not present

## 2022-08-28 ENCOUNTER — Ambulatory Visit: Payer: Self-pay | Admitting: Cardiology

## 2022-08-31 DIAGNOSIS — F4321 Adjustment disorder with depressed mood: Secondary | ICD-10-CM | POA: Diagnosis not present

## 2022-08-31 DIAGNOSIS — F430 Acute stress reaction: Secondary | ICD-10-CM | POA: Diagnosis not present

## 2022-09-06 DIAGNOSIS — A499 Bacterial infection, unspecified: Secondary | ICD-10-CM | POA: Diagnosis not present

## 2022-09-14 DIAGNOSIS — R3121 Asymptomatic microscopic hematuria: Secondary | ICD-10-CM | POA: Diagnosis not present

## 2022-09-14 DIAGNOSIS — N393 Stress incontinence (female) (male): Secondary | ICD-10-CM | POA: Diagnosis not present

## 2022-09-14 DIAGNOSIS — R350 Frequency of micturition: Secondary | ICD-10-CM | POA: Diagnosis not present

## 2022-09-27 DIAGNOSIS — R208 Other disturbances of skin sensation: Secondary | ICD-10-CM | POA: Diagnosis not present

## 2022-09-27 DIAGNOSIS — G4700 Insomnia, unspecified: Secondary | ICD-10-CM | POA: Diagnosis not present

## 2022-09-27 DIAGNOSIS — E559 Vitamin D deficiency, unspecified: Secondary | ICD-10-CM | POA: Diagnosis not present

## 2022-09-27 DIAGNOSIS — N951 Menopausal and female climacteric states: Secondary | ICD-10-CM | POA: Diagnosis not present

## 2022-10-11 DIAGNOSIS — M4800 Spinal stenosis, site unspecified: Secondary | ICD-10-CM | POA: Diagnosis not present

## 2022-10-11 DIAGNOSIS — M5136 Other intervertebral disc degeneration, lumbar region: Secondary | ICD-10-CM | POA: Diagnosis not present

## 2022-10-11 DIAGNOSIS — R350 Frequency of micturition: Secondary | ICD-10-CM | POA: Diagnosis not present

## 2022-10-11 DIAGNOSIS — M5442 Lumbago with sciatica, left side: Secondary | ICD-10-CM | POA: Diagnosis not present

## 2022-10-25 DIAGNOSIS — M4802 Spinal stenosis, cervical region: Secondary | ICD-10-CM | POA: Diagnosis not present

## 2022-10-29 NOTE — Progress Notes (Deleted)
Cardiology Office Note:    Date:  10/29/2022  NAME:  [name]    MRN: [id number] DOB:  05-07-1970   PCP:  Elias Else, MD (Inactive)  Former Cardiology Providers: *** Primary Cardiologist:  None, [facility] (established care 10/29/22) Electrophysiologist:  None   Referring MD: Carilyn Goodpasture, NP  Reason of Consult: Intermittent chest pain  ***  History of Present Illness:    [name] is a 52 y.o. African-American female whose past medical history and cardiovascular risk factors includes: ***. She is being seen today for the evaluation of chest pain at the request of Carilyn Goodpasture, NP.  ***  Current Medications: No outpatient medications have been marked as taking for the 10/30/22 encounter (Appointment) with Tessa Lerner, DO.     Allergies:    Hydrocodone, Oxycodone, and Pollen extract   Past Medical History: Past Medical History:  Diagnosis Date   Anemia    Chest pain    Costal chondritis    Costochondritis    Acute   Costochondritis    Hx of myomectomy 09-30-2008   sr did the surgery   Oth tear of unsp mensc, current injury, unsp knee, sequela    Swine flu 2009   Uterine fibroid     Past Surgical History: Past Surgical History:  Procedure Laterality Date   CERVICAL BIOPSY     MYOMECTOMY     sr did surgery   MYOMECTOMY N/A 09/08/2015   Procedure: ABDOMINAL MYOMECTOMY;  Surgeon: Silverio Lay, MD;  Location: [facility];  Service: Gynecology;  Laterality: N/A;   UTERINE ARTERY EMBOLIZATION      Social History: Social History   Tobacco Use   Smoking status: Never   Smokeless tobacco: Never  Vaping Use   Vaping status: Never Used  Substance Use Topics   Alcohol use: No   Drug use: No    Family History: Family History  Problem Relation Age of Onset   Hypertension Mother    Hypertension Brother    Diabetes Maternal Grandmother    Diabetes Maternal Aunt    Diabetes Maternal Aunt    Diabetes Maternal Aunt     ROS:    ROS  EKGs/Labs/Other Studies Reviewed:   The ekg ordered today was personally reviewed by me. *** EKG Interpretation Date/Time:    Ventricular Rate:    PR Interval:    QRS Duration:    QT Interval:    QTC Calculation:   R Axis:      Text Interpretation:          ***  Labs:    Latest Ref Rng & Units 03/16/2019    6:02 PM 09/09/2015    6:37 AM 09/08/2015    9:58 PM  CBC  WBC 4.0 - 10.5 K/uL 15.8  8.0    Hemoglobin 12.0 - 15.0 g/dL 65.7  7.9  5.9   Hematocrit 36.0 - 46.0 % 41.2  22.9  17.3   Platelets 150 - 400 K/uL 256  131         Latest Ref Rng & Units 03/16/2019    6:02 PM 02/25/2018   12:02 PM 08/27/2015    3:55 PM  BMP  Glucose 70 - 99 mg/dL 846  89  962   BUN 6 - 20 mg/dL 18  14  10    Creatinine 0.44 - 1.00 mg/dL 9.52  8.41  3.24   BUN/Creat Ratio 6 - 22 (calc)  NOT APPLICABLE    Sodium 135 - 145 mmol/L  132  142  133   Potassium 3.5 - 5.1 mmol/L 3.7  4.1  4.0   Chloride 98 - 111 mmol/L 102  109  104   CO2 22 - 32 mmol/L 20  24  22    Calcium 8.9 - 10.3 mg/dL 9.2  9.5  9.2       Latest Ref Rng & Units 03/16/2019    6:02 PM 02/25/2018   12:02 PM 08/27/2015    3:55 PM  CMP  Glucose 70 - 99 mg/dL 161  89  096   BUN 6 - 20 mg/dL 18  14  10    Creatinine 0.44 - 1.00 mg/dL 0.45  4.09  8.11   Sodium 135 - 145 mmol/L 132  142  133   Potassium 3.5 - 5.1 mmol/L 3.7  4.1  4.0   Chloride 98 - 111 mmol/L 102  109  104   CO2 22 - 32 mmol/L 20  24  22    Calcium 8.9 - 10.3 mg/dL 9.2  9.5  9.2   Total Protein 6.1 - 8.1 g/dL  6.9    Total Bilirubin 0.2 - 1.2 mg/dL  0.3    AST 10 - 35 U/L  12    ALT 6 - 29 U/L  8      No results found for: "CHOL", "HDL", "LDLCALC", "LDLDIRECT", "TRIG", "CHOLHDL" No results for input(s): "LIPOA" in the last 8760 hours. No components found for: "NTPROBNP" No results for input(s): "PROBNP" in the last 8760 hours. No results for input(s): "TSH" in the last 8760 hours.  External Labs: Collected: April 2024 provided by PCP. Hemoglobin 13.9  g/dL. BUN 19, creatinine 0.86. Sodium 138, potassium 4.5, chloride 102, bicarb 30. AST, ALT, alkaline phosphatase within normal limits Total cholesterol 234, triglycerides 104, HDL 81 calculated LDL 135, non-HDL 153 Collected: July 2024 provided by referring physician. A1c 6.2.    Physical Exam:   There were no vitals filed for this visit. There is no height or weight on file to calculate BMI. Wt Readings from Last 3 Encounters:  02/25/18 220 lb (99.8 kg)  10/17/16 188 lb (85.3 kg)  08/25/16 188 lb (85.3 kg)    Physical Exam   Impression & Recommendation(s):  Impression: No diagnosis found.   Recommendation(s):  There are no diagnoses linked to this encounter.   Orders Placed:  No orders of the defined types were placed in this encounter.   As part of medical decision making results of the *** were reviewed independently at today's visit.   Final Medication List:   No orders of the defined types were placed in this encounter.   There are no discontinued medications.   Current Outpatient Medications:    AZURETTE 0.15-0.02/0.01 MG (21/5) tablet, TK 1 T PO QD, Disp: , Rfl: 4   desogestrel-ethinyl estradiol (APRI,EMOQUETTE,SOLIA) 0.15-30 MG-MCG tablet, Take 1 tablet by mouth daily., Disp: , Rfl:    FeAsp-B12-FA-C-DSS-SuccAc-Zn (FERIVA 21/7) 75-1 MG TABS, Take 1 tablet by mouth every morning. (Patient not taking: Reported on 09/17/2015), Disp: 28 tablet, Rfl: 1   fluticasone (FLONASE) 50 MCG/ACT nasal spray, , Disp: , Rfl:    GYNAZOLE-1 2 % CREA, , Disp: , Rfl:    ibuprofen (ADVIL,MOTRIN) 600 MG tablet, Take 1 tablet (600 mg total) by mouth 3 (three) times daily., Disp: 21 tablet, Rfl: 0   ipratropium (ATROVENT) 0.06 % nasal spray, , Disp: , Rfl:    levocetirizine (XYZAL) 5 MG tablet, Take 5 mg by mouth daily as needed  for allergies., Disp: , Rfl:   Consent:   ***  Disposition:   No follow-ups on file. or sooner if needed.  Her questions and concerns were addressed to  her satisfaction. She voices understanding of the recommendations provided during this encounter.    Signed, Tessa Lerner, DO, [facility]  [facility]  [address] [place], [place] [address] [phone] 10/29/2022 10:38 PM

## 2022-10-30 ENCOUNTER — Ambulatory Visit: Payer: Managed Care, Other (non HMO) | Attending: Cardiology | Admitting: Cardiology

## 2022-10-31 ENCOUNTER — Encounter: Payer: Self-pay | Admitting: Cardiology

## 2022-11-22 DIAGNOSIS — J029 Acute pharyngitis, unspecified: Secondary | ICD-10-CM | POA: Diagnosis not present

## 2022-11-24 DIAGNOSIS — J069 Acute upper respiratory infection, unspecified: Secondary | ICD-10-CM | POA: Diagnosis not present

## 2023-06-04 DIAGNOSIS — J019 Acute sinusitis, unspecified: Secondary | ICD-10-CM | POA: Diagnosis not present

## 2023-06-26 ENCOUNTER — Other Ambulatory Visit: Payer: Self-pay | Admitting: Obstetrics and Gynecology

## 2023-06-26 DIAGNOSIS — Z0000 Encounter for general adult medical examination without abnormal findings: Secondary | ICD-10-CM

## 2023-08-22 DIAGNOSIS — B3731 Acute candidiasis of vulva and vagina: Secondary | ICD-10-CM | POA: Diagnosis not present

## 2023-08-22 DIAGNOSIS — Z113 Encounter for screening for infections with a predominantly sexual mode of transmission: Secondary | ICD-10-CM | POA: Diagnosis not present

## 2023-08-22 DIAGNOSIS — Z202 Contact with and (suspected) exposure to infections with a predominantly sexual mode of transmission: Secondary | ICD-10-CM | POA: Diagnosis not present

## 2023-09-21 DIAGNOSIS — B3731 Acute candidiasis of vulva and vagina: Secondary | ICD-10-CM | POA: Diagnosis not present

## 2023-09-25 DIAGNOSIS — Z1330 Encounter for screening examination for mental health and behavioral disorders, unspecified: Secondary | ICD-10-CM | POA: Diagnosis not present

## 2023-09-25 DIAGNOSIS — N898 Other specified noninflammatory disorders of vagina: Secondary | ICD-10-CM | POA: Diagnosis not present

## 2023-09-25 DIAGNOSIS — Z01419 Encounter for gynecological examination (general) (routine) without abnormal findings: Secondary | ICD-10-CM | POA: Diagnosis not present

## 2023-09-25 DIAGNOSIS — Z124 Encounter for screening for malignant neoplasm of cervix: Secondary | ICD-10-CM | POA: Diagnosis not present

## 2023-09-27 ENCOUNTER — Other Ambulatory Visit: Payer: Self-pay | Admitting: Obstetrics and Gynecology

## 2023-09-27 DIAGNOSIS — Z1231 Encounter for screening mammogram for malignant neoplasm of breast: Secondary | ICD-10-CM

## 2023-10-19 DIAGNOSIS — N898 Other specified noninflammatory disorders of vagina: Secondary | ICD-10-CM | POA: Diagnosis not present

## 2023-10-26 ENCOUNTER — Ambulatory Visit

## 2023-11-21 ENCOUNTER — Other Ambulatory Visit: Payer: Self-pay

## 2023-11-21 ENCOUNTER — Emergency Department (HOSPITAL_BASED_OUTPATIENT_CLINIC_OR_DEPARTMENT_OTHER)
Admission: EM | Admit: 2023-11-21 | Discharge: 2023-11-21 | Disposition: A | Discharge status: Home / Self Care | Payer: Self-pay | Attending: Emergency Medicine | Admitting: Emergency Medicine

## 2023-11-21 ENCOUNTER — Encounter (HOSPITAL_BASED_OUTPATIENT_CLINIC_OR_DEPARTMENT_OTHER): Payer: Self-pay | Admitting: Emergency Medicine

## 2023-11-21 DIAGNOSIS — K5909 Other constipation: Secondary | ICD-10-CM | POA: Insufficient documentation

## 2023-11-21 MED ORDER — FLEET ENEMA RE ENEM
1.0000 | ENEMA | Freq: Once | RECTAL | Status: AC
  Administered 2023-11-21: 1 via RECTAL
  Filled 2023-11-21: qty 1

## 2023-11-21 MED ORDER — POLYETHYLENE GLYCOL 3350 17 G PO PACK
34.0000 g | PACK | Freq: Once | ORAL | Status: AC
Start: 2023-11-21 — End: 2023-11-21
  Administered 2023-11-21: 34 g via ORAL
  Filled 2023-11-21: qty 2

## 2023-11-21 NOTE — ED Provider Notes (Signed)
 [place] [facility] Provider Note   CSN: [id number] Arrival date & time: 11/21/23  8070     Patient presents with: Fecal Impaction   [name] is a 53 y.o. female with no significant past medical history presents with concern for constipation for the past couple days.  She reports that she switched to a keto diet last week, and was eating a lot of cheese, and thinks this constipated her.  She has taken Dulcolax at home and has had some small bowel movements, but still feels like she has more stool that needs to come out.  She is still passing gas.  No vomiting.  Does report some crampy abdominal pain.  No fevers or chills.   HPI     Prior to Admission medications   Medication Sig Start Date End Date Taking? Authorizing Provider  AZURETTE 0.15-0.02/0.01 MG (21/5) tablet TK 1 T PO QD 05/02/16   [provider]  desogestrel-ethinyl estradiol (APRI,EMOQUETTE,SOLIA) 0.15-30 MG-MCG tablet Take 1 tablet by mouth daily.    [provider]  FeAsp-B12-FA-C-DSS-SuccAc-Zn (FERIVA 21/7) 75-1 MG TABS Take 1 tablet by mouth every morning. Patient not taking: Reported on 09/17/2015 09/09/15   Darcel Pool, MD  fluticasone OREN) 50 MCG/ACT nasal spray  08/22/16   [provider]  GYNAZOLE-1 2 % CREA  07/29/16   [provider]  ibuprofen  (ADVIL ,MOTRIN ) 600 MG tablet Take 1 tablet (600 mg total) by mouth 3 (three) times daily. 02/25/18   Humberto Elspeth LABOR, MD  ipratropium (ATROVENT) 0.06 % nasal spray  08/22/16   [provider]  levocetirizine (XYZAL) 5 MG tablet Take 5 mg by mouth daily as needed for allergies.    [provider]    Allergies: Hydrocodone , Oxycodone , and Pollen extract    Review of Systems  Gastrointestinal:  Positive for abdominal pain.    Updated Vital Signs BP (!) 167/77   Pulse 70   Temp 97.6 F (36.4 C) (Oral)   Resp 16   Ht 5' 3 (1.6 m)   Wt 109.8 kg   SpO2 100%   BMI  42.87 kg/m   Physical Exam Vitals and nursing note reviewed. Exam conducted with a chaperone present.  Constitutional:      General: She is not in acute distress.    Appearance: She is well-developed.  HENT:     Head: Normocephalic and atraumatic.  Eyes:     Conjunctiva/sclera: Conjunctivae normal.  Cardiovascular:     Rate and Rhythm: Normal rate and regular rhythm.     Heart sounds: No murmur heard. Pulmonary:     Effort: Pulmonary effort is normal. No respiratory distress.     Breath sounds: Normal breath sounds.  Abdominal:     Palpations: Abdomen is soft.     Tenderness: There is no abdominal tenderness.  Genitourinary:    Comments: RN Regino Browner present to chaperone rectal exam  I cannot appreciate a stool ball at the rectum. Musculoskeletal:        General: No swelling.     Cervical back: Neck supple.  Skin:    General: Skin is warm and dry.     Capillary Refill: Capillary refill takes less than 2 seconds.  Neurological:     Mental Status: She is alert.  Psychiatric:        Mood and Affect: Mood normal.     (all labs ordered are listed, but only abnormal results are displayed) Labs Reviewed - No data  to display  EKG: None  Radiology: No results found.   Procedures   Medications Ordered in the ED  sodium phosphate  (FLEET) enema 1 enema (1 enema Rectal Given 11/21/23 2200)  polyethylene glycol (MIRALAX / GLYCOLAX) packet 34 g (34 g Oral Given 11/21/23 2258)                                    Medical Decision Making Risk OTC drugs.     Differential diagnosis includes but is not limited to constipation, SBO, malignancy  ED Course:  Upon initial evaluation, patient is well-appearing, stable vitals aside from elevated blood pressure of 151/80 upon arrival.  Abdomen is soft and nontender.  On rectal exam with RN chaperone present, I was unable to appreciate any stool ball at the rectal opening.  We discussed that since she was having some  bowel movements, no vomiting, I have low clinical concern for SBO.  This constipation seems to be result of her diet change.  No indication for labs or imaging at this time.  Will attempt enema.  Upon reevaluation, patient has not had a bowel movement with enema.  I was unable to digitally disimpact given unable to feel stool ball.  We will discharge home with trying MiraLAX for her constipation since she was having some bowel movements, just feels that there is still retained stool  Strict return precautions were discussed.  Medications Given: Fleet enema  Impression: Constipation  Disposition:  The patient was discharged home with instructions to take 7 scoops of miralax in the morning and evening tomorrow.  Discussed high-fiber diet and drinking plenty of water to help with bowel movements. Return to ER if no bowel movement in the next 2-3 days. Follow-up with PCP if she continues to struggle with constipation Return precautions given and patient verbalized understanding.  This chart was dictated using voice recognition software, Dragon. Despite the best efforts of this provider to proofread and correct errors, errors may still occur which can change documentation meaning.       Final diagnoses:  Other constipation    ED Discharge Orders     None          [name], DEVONNA 11/21/23 2313    Towana Ozell BROCKS, MD 11/22/23 [phone]

## 2023-11-21 NOTE — ED Triage Notes (Signed)
 Pt reports recently starting keto diet, c/o constipation x 1 day.  Reports taking duclolax, and stool softener x 3 days with minimal relief.  LBM yesterday, small. States she can feel hard stool at rectum.   C/o abd cramping.

## 2023-11-21 NOTE — Discharge Instructions (Addendum)
 Drink plenty of fluids at home and eat a fiber fillet diet (lots of fruits and vegetables). Please engage in daily exercise as this also helps to maintain regular bowel movements.   Try to use the bathroom after eating a meal. Place your feet up on a small stepstool when trying to have a bowel movement.  You may take 7 scoop(s) of Miralax in the morning and 7 scoops tomorrow evening mixed in with plenty of water.  This should help you to have a bowel movement.  You can get this over-the-counter at any drugstore.  Return to the ER if you still have not had a bowel movement within the next 2-3 days, you begin vomiting, you no longer are passing gas.  Please follow up with your PCP within the next month to address strategies to prevent constipation in the future.
